# Patient Record
Sex: Female | Born: 1970 | Race: Black or African American | Hispanic: No | Marital: Married | Smoking: Never smoker
Health system: Southern US, Community
[De-identification: ages and names within clinical notes are randomized; demographics above are authoritative.]

## PROBLEM LIST (undated history)

## (undated) DIAGNOSIS — I10 Essential (primary) hypertension: Secondary | ICD-10-CM

## (undated) HISTORY — PX: TEAR DUCT PROBING: SHX793

## (undated) HISTORY — DX: Essential (primary) hypertension: I10

---

## 2006-04-02 ENCOUNTER — Ambulatory Visit: Payer: Self-pay

## 2007-04-29 ENCOUNTER — Emergency Department: Payer: Self-pay | Admitting: Emergency Medicine

## 2009-03-21 ENCOUNTER — Encounter: Payer: Self-pay | Admitting: Obstetrics and Gynecology

## 2009-04-25 ENCOUNTER — Encounter: Payer: Self-pay | Admitting: Obstetrics and Gynecology

## 2009-09-02 ENCOUNTER — Ambulatory Visit: Payer: Self-pay

## 2009-10-30 ENCOUNTER — Inpatient Hospital Stay: Payer: Self-pay

## 2010-01-25 DIAGNOSIS — K219 Gastro-esophageal reflux disease without esophagitis: Secondary | ICD-10-CM | POA: Insufficient documentation

## 2013-11-08 ENCOUNTER — Ambulatory Visit: Payer: Self-pay | Admitting: Family Medicine

## 2013-11-08 DIAGNOSIS — I059 Rheumatic mitral valve disease, unspecified: Secondary | ICD-10-CM

## 2013-11-08 LAB — LIPID PANEL
Cholesterol: 180 mg/dL (ref 0–200)
HDL: 53 mg/dL (ref 35–70)
LDL Cholesterol: 112 mg/dL
Triglycerides: 77 mg/dL (ref 40–160)

## 2013-11-09 ENCOUNTER — Encounter: Payer: Self-pay | Admitting: Cardiovascular Disease

## 2014-04-01 ENCOUNTER — Emergency Department: Payer: Self-pay | Admitting: Emergency Medicine

## 2014-05-16 LAB — TSH: TSH: 1.53 u[IU]/mL (ref <=5.90)

## 2014-09-26 ENCOUNTER — Encounter: Payer: Self-pay | Admitting: Emergency Medicine

## 2014-11-07 ENCOUNTER — Other Ambulatory Visit: Payer: Self-pay | Admitting: Family Medicine

## 2014-11-15 ENCOUNTER — Ambulatory Visit: Payer: Self-pay | Admitting: Family Medicine

## 2014-12-20 ENCOUNTER — Encounter (INDEPENDENT_AMBULATORY_CARE_PROVIDER_SITE_OTHER): Payer: Self-pay

## 2014-12-20 ENCOUNTER — Encounter: Payer: Self-pay | Admitting: Family Medicine

## 2014-12-20 ENCOUNTER — Ambulatory Visit (INDEPENDENT_AMBULATORY_CARE_PROVIDER_SITE_OTHER): Payer: BLUE CROSS/BLUE SHIELD | Admitting: Family Medicine

## 2014-12-20 VITALS — BP 110/60 | HR 100 | Temp 97.2°F | Resp 16 | Ht 61.0 in | Wt 155.1 lb

## 2014-12-20 DIAGNOSIS — E669 Obesity, unspecified: Secondary | ICD-10-CM | POA: Diagnosis not present

## 2014-12-20 DIAGNOSIS — I10 Essential (primary) hypertension: Secondary | ICD-10-CM | POA: Diagnosis not present

## 2014-12-20 MED ORDER — LISINOPRIL-HYDROCHLOROTHIAZIDE 10-12.5 MG PO TABS: 1.0000 | ORAL_TABLET | Freq: Every day | ORAL | Status: DC

## 2014-12-20 MED ORDER — ORLISTAT 120 MG PO CAPS
120.0000 mg | ORAL_CAPSULE | Freq: Three times a day (TID) | ORAL | Status: DC
Start: 2014-12-20 — End: 2015-11-07

## 2014-12-20 NOTE — Patient Instructions (Signed)

## 2014-12-20 NOTE — Progress Notes (Signed)
Name: [name] [name]   MRN: [id number]    DOB: 16-May-1971   Date:12/20/2014       Progress Note  Subjective  Chief Complaint  Chief Complaint  Patient presents with  . Hypertension    Hypertension This is a chronic problem. The current episode started more than 1 year ago. The problem is unchanged. The problem is controlled. Associated symptoms include anxiety. Pertinent negatives include no blurred vision, chest pain, headaches, neck pain, orthopnea, palpitations or shortness of breath. Risk factors for coronary artery disease include sedentary lifestyle, stress and obesity. Past treatments include ACE inhibitors and diuretics. The current treatment provides moderate improvement. There are no compliance problems.     OBESITY  Patient has a long-standing history of chronic problem. To start exercising with regularity willing encourage Paleo diet  Past Medical History  Diagnosis Date  . Essential hypertension, benign     History  Substance Use Topics  . Smoking status: Never Smoker   . Smokeless tobacco: Not on file  . Alcohol Use: No     Current outpatient prescriptions:  .  lisinopril-hydrochlorothiazide (PRINZIDE,ZESTORETIC) 10-12.5 MG per tablet, Take 1 tablet by mouth daily., Disp: 90 tablet, Rfl: 1 .  Omega-3 Fatty Acids (FISH OIL) 645 MG CAPS, Take by mouth., Disp: , Rfl:   Allergies  Allergen Reactions  . Zantac [Ranitidine Hcl]     Review of Systems  Constitutional: Negative for fever, chills and weight loss.  HENT: Negative for congestion, hearing loss, sore throat and tinnitus.   Eyes: Negative for blurred vision, double vision and redness.  Respiratory: Negative for cough, hemoptysis and shortness of breath.   Cardiovascular: Negative for chest pain, palpitations, orthopnea, claudication and leg swelling.  Gastrointestinal: Negative for heartburn, nausea, vomiting, diarrhea, constipation and blood in stool.  Genitourinary: Negative for dysuria, urgency,  frequency and hematuria.  Musculoskeletal: Negative for myalgias, back pain, joint pain, falls and neck pain.  Skin: Negative for itching.  Neurological: Negative for dizziness, tingling, tremors, focal weakness, seizures, loss of consciousness, weakness and headaches.  Endo/Heme/Allergies: Does not bruise/bleed easily.  Psychiatric/Behavioral: Negative for depression and substance abuse. The patient is not nervous/anxious and does not have insomnia.      Objective  Filed Vitals:   12/20/14 0742  BP: 110/60  Pulse: 100  Temp: 97.2 F (36.2 C)  TempSrc: Oral  Resp: 16  Height: 5\' 1"  (1.549 m)  Weight: 155 lb 1.6 oz (70.353 kg)  SpO2: 98%     Physical Exam  Constitutional: She is oriented to person, place, and time and well-developed, well-nourished, and in no distress.  Overweight bordering obese  HENT:  Head: Normocephalic.  Eyes: EOM are normal. Pupils are equal, round, and reactive to light.  Neck: Normal range of motion. No thyromegaly present.  Cardiovascular: Normal rate, regular rhythm and normal heart sounds.   No murmur heard. Pulmonary/Chest: Effort normal and breath sounds normal.  Musculoskeletal: Normal range of motion. She exhibits no edema.  Neurological: She is alert and oriented to person, place, and time. No cranial nerve deficit. Gait normal.  Skin: Skin is warm and dry. No rash noted.  Psychiatric: Memory and affect normal.      Assessment & Plan  1. Essential hypertension Well-controlled - COMPLETE METABOLIC PANEL WITH GFR  2. Obesity Prudent diet and exercise - orlistat (XENICAL) 120 MG capsule; Take 1 capsule (120 mg total) by mouth 3 (three) times daily with meals.  Dispense: 90 capsule; Refill: 1

## 2015-02-28 ENCOUNTER — Encounter: Payer: Self-pay | Admitting: Family Medicine

## 2015-02-28 ENCOUNTER — Ambulatory Visit (INDEPENDENT_AMBULATORY_CARE_PROVIDER_SITE_OTHER): Payer: BLUE CROSS/BLUE SHIELD | Admitting: Family Medicine

## 2015-02-28 VITALS — BP 109/71 | HR 110 | Temp 98.9°F | Resp 17 | Ht 61.0 in | Wt 155.7 lb

## 2015-02-28 DIAGNOSIS — Z23 Encounter for immunization: Secondary | ICD-10-CM

## 2015-02-28 DIAGNOSIS — N3000 Acute cystitis without hematuria: Secondary | ICD-10-CM | POA: Diagnosis not present

## 2015-02-28 MED ORDER — NITROFURANTOIN MONOHYD MACRO 100 MG PO CAPS: 100.0000 mg | ORAL_CAPSULE | Freq: Two times a day (BID) | ORAL | Status: DC

## 2015-02-28 NOTE — Progress Notes (Signed)
Name: [name] [name]   MRN: [id number]    DOB: 27-Jun-1970   Date:02/28/2015       Progress Note  Subjective  Chief Complaint  Chief Complaint  Patient presents with  . Urinary Tract Infection  . Hypertension  . Hyperlipidemia  . Gastrophageal Reflux    Urinary Tract Infection  This is a new problem. The current episode started today. The quality of the pain is described as burning. There has been no fever. There is no history of pyelonephritis. Associated symptoms include frequency. Pertinent negatives include no chills, discharge or flank pain. She has tried nothing for the symptoms.   symptoms are similar to prior episodes of UTI.  Past Medical History  Diagnosis Date  . Essential hypertension, benign     Past Surgical History  Procedure Laterality Date  . Tear duct probing    . Cesarean section      Family History  Problem Relation Age of Onset  . Hypertension Mother   . Hyperlipidemia Mother   . Hypertension Father     Social History   Social History  . Marital Status: Married    Spouse Name: N/A  . Number of Children: N/A  . Years of Education: N/A   Occupational History  . Not on file.   Social History Main Topics  . Smoking status: Never Smoker   . Smokeless tobacco: Not on file  . Alcohol Use: No  . Drug Use: No  . Sexual Activity:    Partners: Female   Other Topics Concern  . Not on file   Social History Narrative    Current outpatient prescriptions:  .  lisinopril-hydrochlorothiazide (PRINZIDE,ZESTORETIC) 10-12.5 MG per tablet, Take 1 tablet by mouth daily., Disp: 90 tablet, Rfl: 1 .  Omega-3 Fatty Acids (FISH OIL) 645 MG CAPS, Take by mouth., Disp: , Rfl:  .  orlistat (XENICAL) 120 MG capsule, Take 1 capsule (120 mg total) by mouth 3 (three) times daily with meals. (Patient not taking: Reported on 02/28/2015), Disp: 90 capsule, Rfl: 1  Allergies  Allergen Reactions  . Zantac [Ranitidine Hcl]    Review of Systems  Constitutional:  Negative for chills.  Genitourinary: Positive for frequency. Negative for flank pain.    Objective  Filed Vitals:   02/28/15 0901  BP: 109/71  Pulse: 110  Temp: 98.9 F (37.2 C)  TempSrc: Oral  Resp: 17  Height: 5\' 1"  (1.549 m)  Weight: 155 lb 11.2 oz (70.625 kg)  SpO2: 99%    Physical Exam  Constitutional: She is well-developed, well-nourished, and in no distress.  Abdominal: Soft. Bowel sounds are normal. There is no CVA tenderness.  Nursing note and vitals reviewed.  Assessment & Plan  1. Need for immunization against influenza   2. Acute cystitis without hematuria Start patient on nitrofurantoin based on history and presentation. Obtain urinalysis, microscopic exam, and culture for identification of pathogen. Follow-up if no clinical improvement.  - nitrofurantoin, macrocrystal-monohydrate, (MACROBID) 100 MG capsule; Take 1 capsule (100 mg total) by mouth 2 (two) times daily.  Dispense: 14 capsule; Refill: 0 - Urinalysis, Routine w reflex microscopic - Urine Culture    Syed Asad [facility] 02/28/2015 9:41 AM

## 2015-03-01 LAB — URINALYSIS, ROUTINE W REFLEX MICROSCOPIC
Bilirubin, UA: NEGATIVE
Glucose, UA: NEGATIVE
Ketones, UA: NEGATIVE
Nitrite, UA: NEGATIVE
Specific Gravity, UA: 1.027 (ref 1.005–1.030)
Urobilinogen, Ur: 1 mg/dL (ref 0.2–1.0)
pH, UA: 6.5 (ref 5.0–7.5)

## 2015-03-01 LAB — MICROSCOPIC EXAMINATION
Casts: NONE SEEN /lpf
RBC, UA: >30 /hpf — AB (ref 0–?)
WBC, UA: >30 /hpf — AB (ref 0–?)

## 2015-03-02 LAB — URINE CULTURE

## 2015-04-04 LAB — HM PAP SMEAR: HM Pap smear: NEGATIVE

## 2015-05-01 ENCOUNTER — Encounter: Payer: Self-pay | Admitting: Family Medicine

## 2015-07-03 ENCOUNTER — Other Ambulatory Visit: Payer: Self-pay | Admitting: Family Medicine

## 2015-08-30 ENCOUNTER — Other Ambulatory Visit: Payer: Self-pay | Admitting: Family Medicine

## 2015-09-13 DIAGNOSIS — B373 Candidiasis of vulva and vagina: Secondary | ICD-10-CM | POA: Diagnosis not present

## 2015-09-26 ENCOUNTER — Encounter: Payer: Self-pay | Admitting: Family Medicine

## 2015-09-26 ENCOUNTER — Ambulatory Visit (INDEPENDENT_AMBULATORY_CARE_PROVIDER_SITE_OTHER): Payer: BLUE CROSS/BLUE SHIELD | Admitting: Family Medicine

## 2015-09-26 VITALS — BP 107/71 | HR 111 | Temp 98.8°F | Resp 18 | Ht 61.0 in | Wt 154.3 lb

## 2015-09-26 DIAGNOSIS — B373 Candidiasis of vulva and vagina: Secondary | ICD-10-CM

## 2015-09-26 DIAGNOSIS — B3731 Acute candidiasis of vulva and vagina: Secondary | ICD-10-CM

## 2015-09-26 MED ORDER — NYSTATIN 100000 UNIT/GM EX OINT: 1.0000 "application " | TOPICAL_OINTMENT | Freq: Two times a day (BID) | CUTANEOUS | Status: DC

## 2015-09-26 NOTE — Progress Notes (Signed)
Name: [name] [name]   MRN: [id number]    DOB: 20-Jun-1970   Date:09/26/2015       Progress Note  Subjective  Chief Complaint  Chief Complaint  Patient presents with  . Hemorrhoids    HPI  Pt. Presents for discomfort in her rectal and perianal area. Initially seen by Gynecologist and had a Pelvic exam and was diagnosed with a yeast infection, which is better after antifungal therapy. She feels her anal skin is burning, itching. Has tried applying Preparation H, which helped relieve her symptoms.   Past Medical History  Diagnosis Date  . Essential hypertension, benign     Past Surgical History  Procedure Laterality Date  . Tear duct probing    . Cesarean section      Family History  Problem Relation Age of Onset  . Hypertension Mother   . Hyperlipidemia Mother   . Hypertension Father     Social History   Social History  . Marital Status: Married    Spouse Name: N/A  . Number of Children: N/A  . Years of Education: N/A   Occupational History  . Not on file.   Social History Main Topics  . Smoking status: Never Smoker   . Smokeless tobacco: Not on file  . Alcohol Use: No  . Drug Use: No  . Sexual Activity:    Partners: Female   Other Topics Concern  . Not on file   Social History Narrative     Current outpatient prescriptions:  .  lisinopril-hydrochlorothiazide (PRINZIDE,ZESTORETIC) 10-12.5 MG tablet, TAKE 1 TABLET BY MOUTH DAILY., Disp: 30 tablet, Rfl: 1 .  Omega-3 Fatty Acids (FISH OIL) 645 MG CAPS, Take by mouth., Disp: , Rfl:  .  orlistat (XENICAL) 120 MG capsule, Take 1 capsule (120 mg total) by mouth 3 (three) times daily with meals., Disp: 90 capsule, Rfl: 1 .  nitrofurantoin, macrocrystal-monohydrate, (MACROBID) 100 MG capsule, Take 1 capsule (100 mg total) by mouth 2 (two) times daily. (Patient not taking: Reported on 09/26/2015), Disp: 14 capsule, Rfl: 0  Allergies  Allergen Reactions  . Zantac [Ranitidine Hcl]      Review of Systems   Constitutional: Negative for fever and chills.  Skin: Positive for itching and rash.      Objective  Filed Vitals:   09/26/15 1138  BP: 107/71  Pulse: 111  Temp: 98.8 F (37.1 C)  TempSrc: Oral  Resp: 18  Height: 5\' 1"  (1.549 m)  Weight: 154 lb 4.8 oz (69.99 kg)  SpO2: 99%    Physical Exam  Constitutional: She is oriented to person, place, and time and well-developed, well-nourished, and in no distress.  Genitourinary: Vulva exhibits rash.  Macular, erythematous pruritic lesion on the right groin area next to labia majora extending down to perianal area.   Neurological: She is alert and oriented to person, place, and time.  Nursing note and vitals reviewed.      Assessment & Plan  1. Vulvovaginal candidiasis Lesion consistent with candidiasis, will start on topical nystatin therapy. Patient to follow up with gynecology. - nystatin ointment (MYCOSTATIN); Apply 1 application topically 2 (two) times daily.  Dispense: 30 g; Refill: 0   Cashis Rill Asad A[facility] 09/26/2015 12:17 PM

## 2015-10-02 ENCOUNTER — Ambulatory Visit: Payer: BLUE CROSS/BLUE SHIELD | Admitting: Family Medicine

## 2015-11-05 ENCOUNTER — Other Ambulatory Visit: Payer: Self-pay | Admitting: Family Medicine

## 2015-11-06 ENCOUNTER — Telehealth: Payer: Self-pay | Admitting: Family Medicine

## 2015-11-06 NOTE — Telephone Encounter (Signed)
Patient informed. 

## 2015-11-06 NOTE — Telephone Encounter (Signed)
Was seen last month but only have 2 pills left of lisinopril. She is requesting a refill

## 2015-11-06 NOTE — Telephone Encounter (Signed)
Patient is scheduled for 11/07/2015 for an office visit appointment. Will discuss and provide medication refill at that time

## 2015-11-07 ENCOUNTER — Encounter: Payer: Self-pay | Admitting: Family Medicine

## 2015-11-07 ENCOUNTER — Ambulatory Visit (INDEPENDENT_AMBULATORY_CARE_PROVIDER_SITE_OTHER): Payer: BLUE CROSS/BLUE SHIELD | Admitting: Family Medicine

## 2015-11-07 VITALS — BP 118/72 | HR 118 | Temp 98.6°F | Resp 18 | Ht 61.0 in | Wt 156.2 lb

## 2015-11-07 DIAGNOSIS — I10 Essential (primary) hypertension: Secondary | ICD-10-CM | POA: Diagnosis not present

## 2015-11-07 MED ORDER — LISINOPRIL-HYDROCHLOROTHIAZIDE 10-12.5 MG PO TABS: 1.0000 | ORAL_TABLET | Freq: Every day | ORAL | Status: DC

## 2015-11-07 NOTE — Progress Notes (Signed)
Name: [name] [name]   MRN: [id number]    DOB: 22-Jul-1970   Date:11/07/2015       Progress Note  Subjective  Chief Complaint  Chief Complaint  Patient presents with  . Medication Refill    B/P meds    Hypertension The problem is controlled. Pertinent negatives include no chest pain, headaches or palpitations. Past treatments include ACE inhibitors and diuretics.    Past Medical History  Diagnosis Date  . Essential hypertension, benign     Past Surgical History  Procedure Laterality Date  . Tear duct probing    . Cesarean section      Family History  Problem Relation Age of Onset  . Hypertension Mother   . Hyperlipidemia Mother   . Hypertension Father     Social History   Social History  . Marital Status: Married    Spouse Name: N/A  . Number of Children: N/A  . Years of Education: N/A   Occupational History  . Not on file.   Social History Main Topics  . Smoking status: Never Smoker   . Smokeless tobacco: Not on file  . Alcohol Use: No  . Drug Use: No  . Sexual Activity:    Partners: Female   Other Topics Concern  . Not on file   Social History Narrative     Current outpatient prescriptions:  .  lisinopril-hydrochlorothiazide (PRINZIDE,ZESTORETIC) 10-12.5 MG tablet, TAKE 1 TABLET BY MOUTH DAILY., Disp: 30 tablet, Rfl: 1 .  Omega-3 Fatty Acids (FISH OIL) 645 MG CAPS, Take by mouth., Disp: , Rfl:  .  nitrofurantoin, macrocrystal-monohydrate, (MACROBID) 100 MG capsule, Take 1 capsule (100 mg total) by mouth 2 (two) times daily. (Patient not taking: Reported on 09/26/2015), Disp: 14 capsule, Rfl: 0 .  nystatin ointment (MYCOSTATIN), Apply 1 application topically 2 (two) times daily., Disp: 30 g, Rfl: 0  Allergies  Allergen Reactions  . Zantac [Ranitidine Hcl]      Review of Systems  Cardiovascular: Negative for chest pain and palpitations.  Neurological: Negative for headaches.    Objective  Filed Vitals:   11/07/15 1538  BP: 118/72   Pulse: 118  Temp: 98.6 F (37 C)  Resp: 18  Height: 5\' 1"  (1.549 m)  Weight: 156 lb 4 oz (70.875 kg)  SpO2: 97%    Physical Exam  Constitutional: She is oriented to person, place, and time and well-developed, well-nourished, and in no distress.  HENT:  Head: Normocephalic and atraumatic.  Cardiovascular: Normal rate and regular rhythm.   Pulmonary/Chest: Effort normal and breath sounds normal.  Musculoskeletal: She exhibits no edema.  Neurological: She is alert and oriented to person, place, and time.  Nursing note and vitals reviewed.     Assessment & Plan  1. Essential hypertension Blood pressure is stable and controlled on present antihypertensive therapy. Refills provided - lisinopril-hydrochlorothiazide (PRINZIDE,ZESTORETIC) 10-12.5 MG tablet; Take 1 tablet by mouth daily.  Dispense: 90 tablet; Refill: 1   Kamoni Depree Asad A[facility] 11/07/2015 3:54 PM

## 2016-05-04 ENCOUNTER — Other Ambulatory Visit: Payer: Self-pay | Admitting: Family Medicine

## 2016-05-04 DIAGNOSIS — I10 Essential (primary) hypertension: Secondary | ICD-10-CM

## 2016-05-05 ENCOUNTER — Other Ambulatory Visit: Payer: Self-pay | Admitting: Family Medicine

## 2016-05-05 ENCOUNTER — Other Ambulatory Visit: Payer: Self-pay | Admitting: Emergency Medicine

## 2016-05-05 DIAGNOSIS — I10 Essential (primary) hypertension: Secondary | ICD-10-CM

## 2016-05-05 MED ORDER — LISINOPRIL-HYDROCHLOROTHIAZIDE 10-12.5 MG PO TABS: 1.0000 | ORAL_TABLET | Freq: Every day | ORAL | 0 refills | Status: DC

## 2016-05-08 ENCOUNTER — Ambulatory Visit (INDEPENDENT_AMBULATORY_CARE_PROVIDER_SITE_OTHER): Payer: BLUE CROSS/BLUE SHIELD | Admitting: Family Medicine

## 2016-05-08 ENCOUNTER — Encounter: Payer: Self-pay | Admitting: Family Medicine

## 2016-05-08 DIAGNOSIS — I10 Essential (primary) hypertension: Secondary | ICD-10-CM | POA: Diagnosis not present

## 2016-05-08 DIAGNOSIS — E785 Hyperlipidemia, unspecified: Secondary | ICD-10-CM | POA: Diagnosis not present

## 2016-05-08 MED ORDER — LISINOPRIL-HYDROCHLOROTHIAZIDE 10-12.5 MG PO TABS: 1.0000 | ORAL_TABLET | Freq: Every day | ORAL | 1 refills | Status: DC

## 2016-05-08 NOTE — Progress Notes (Signed)
Name: [name] [name]   MRN: [id number]    DOB: July 01, 1970   Date:05/08/2016       Progress Note  Subjective  Chief Complaint  Chief Complaint  Patient presents with  . Hypertension    follow up medication refills    Hypertension  This is a chronic problem. The problem is unchanged. The problem is controlled. Pertinent negatives include no blurred vision, chest pain, headaches, palpitations or shortness of breath. Past treatments include ACE inhibitors and diuretics. There is no history of kidney disease, CAD/MI or CVA.  Hyperlipidemia  This is a chronic problem. The problem is uncontrolled. Recent lipid tests were reviewed and are high. Pertinent negatives include no chest pain or shortness of breath. She is currently on no antihyperlipidemic treatment.      Past Medical History:  Diagnosis Date  . Essential hypertension, benign     Past Surgical History:  Procedure Laterality Date  . CESAREAN SECTION    . TEAR DUCT PROBING      Family History  Problem Relation Age of Onset  . Hypertension Mother   . Hyperlipidemia Mother   . Hypertension Father     Social History   Social History  . Marital status: Married    Spouse name: N/A  . Number of children: N/A  . Years of education: N/A   Occupational History  . Not on file.   Social History Main Topics  . Smoking status: Never Smoker  . Smokeless tobacco: Not on file  . Alcohol use No  . Drug use: No  . Sexual activity: Yes    Partners: Female   Other Topics Concern  . Not on file   Social History Narrative  . No narrative on file     Current Outpatient Prescriptions:  .  lisinopril-hydrochlorothiazide (PRINZIDE,ZESTORETIC) 10-12.5 MG tablet, Take 1 tablet by mouth daily., Disp: 5 tablet, Rfl: 0 .  Omega-3 Fatty Acids (FISH OIL) 645 MG CAPS, Take by mouth., Disp: , Rfl:   Allergies  Allergen Reactions  . Zantac [Ranitidine Hcl]      Review of Systems  Eyes: Negative for blurred vision.   Respiratory: Negative for shortness of breath.   Cardiovascular: Negative for chest pain and palpitations.  Neurological: Negative for headaches.     Objective  Vitals:   05/08/16 0810  BP: 124/80  Pulse: (!) 102  Resp: 16  Temp: 98.5 F (36.9 C)  TempSrc: Oral  SpO2: 98%  Weight: 158 lb 4.8 oz (71.8 kg)  Height: 5\' 1"  (1.549 m)    Physical Exam  Constitutional: She is oriented to person, place, and time and well-developed, well-nourished, and in no distress.  HENT:  Head: Normocephalic and atraumatic.  Eyes: Conjunctivae are normal. Pupils are equal, round, and reactive to light.  Cardiovascular: Normal rate, regular rhythm and normal heart sounds.   No murmur heard. Pulmonary/Chest: Effort normal and breath sounds normal. No respiratory distress. She has no wheezes.  Musculoskeletal: She exhibits no edema.  Neurological: She is alert and oriented to person, place, and time.  Psychiatric: Mood, memory, affect and judgment normal.  Nursing note and vitals reviewed.       Assessment & Plan  1. Essential hypertension  - lisinopril-hydrochlorothiazide (PRINZIDE,ZESTORETIC) 10-12.5 MG tablet; Take 1 tablet by mouth daily.  Dispense: 90 tablet; Refill: 1  2. Hyperlipidemia, unspecified hyperlipidemia type  - Lipid Profile - COMPLETE METABOLIC PANEL WITH GFR   De Jaworski Asad A. [facility] 05/08/2016  8:35 AM

## 2016-05-09 LAB — COMPLETE METABOLIC PANEL WITH GFR
ALT: 13 U/L (ref 6–29)
AST: 18 U/L (ref 10–35)
Albumin: 4.2 g/dL (ref 3.6–5.1)
Alkaline Phosphatase: 50 U/L (ref 33–115)
BUN: 11 mg/dL (ref 7–25)
CO2: 27 mmol/L (ref 20–31)
Calcium: 9.3 mg/dL (ref 8.6–10.2)
Chloride: 103 mmol/L (ref 98–110)
Creat: 0.87 mg/dL (ref 0.50–1.10)
GFR, Est African American: >89 mL/min (ref >=60)
GFR, Est Non African American: 80 mL/min (ref >=60)
Glucose, Bld: 85 mg/dL (ref 65–99)
Potassium: 4.3 mmol/L (ref 3.5–5.3)
Sodium: 137 mmol/L (ref 135–146)
Total Bilirubin: 0.3 mg/dL (ref 0.2–1.2)
Total Protein: 6.8 g/dL (ref 6.1–8.1)

## 2016-05-09 LAB — LIPID PANEL
Cholesterol: 182 mg/dL (ref <200)
HDL: 50 mg/dL — ABNORMAL LOW (ref >50)
LDL Cholesterol: 112 mg/dL — ABNORMAL HIGH (ref <100)
Total CHOL/HDL Ratio: 3.6 Ratio (ref <5.0)
Triglycerides: 100 mg/dL (ref <150)
VLDL: 20 mg/dL (ref <30)

## 2016-10-27 ENCOUNTER — Other Ambulatory Visit: Payer: Self-pay | Admitting: Family Medicine

## 2016-10-27 DIAGNOSIS — I10 Essential (primary) hypertension: Secondary | ICD-10-CM

## 2016-10-30 ENCOUNTER — Telehealth: Payer: Self-pay | Admitting: Family Medicine

## 2016-10-30 ENCOUNTER — Other Ambulatory Visit: Payer: Self-pay | Admitting: Emergency Medicine

## 2016-10-30 DIAGNOSIS — I10 Essential (primary) hypertension: Secondary | ICD-10-CM

## 2016-10-30 MED ORDER — LISINOPRIL-HYDROCHLOROTHIAZIDE 10-12.5 MG PO TABS: 1.0000 | ORAL_TABLET | Freq: Every day | ORAL | 0 refills | Status: DC

## 2016-10-30 NOTE — Telephone Encounter (Signed)
Pt is needing refills on lisinopril. You had said that she needs to make an appt .She has made an  appt for Tuesday the 29th but is out of her medication. Pharm is [facility].

## 2016-11-03 ENCOUNTER — Encounter: Payer: Self-pay | Admitting: Family Medicine

## 2016-11-03 ENCOUNTER — Ambulatory Visit (INDEPENDENT_AMBULATORY_CARE_PROVIDER_SITE_OTHER): Payer: BLUE CROSS/BLUE SHIELD | Admitting: Family Medicine

## 2016-11-03 DIAGNOSIS — I10 Essential (primary) hypertension: Secondary | ICD-10-CM | POA: Diagnosis not present

## 2016-11-03 MED ORDER — LISINOPRIL-HYDROCHLOROTHIAZIDE 10-12.5 MG PO TABS: 1.0000 | ORAL_TABLET | Freq: Every day | ORAL | 1 refills | Status: DC

## 2016-11-03 NOTE — Progress Notes (Signed)
Name: [name] [name]   MRN: [id number]    DOB: Apr 19, 1971   Date:11/03/2016       Progress Note  Subjective  Chief Complaint  Chief Complaint  Patient presents with  . Medication Refill    Hypertension  This is a chronic problem. The problem is unchanged. The problem is controlled. Pertinent negatives include no blurred vision, chest pain, headaches or palpitations. Past treatments include ACE inhibitors and diuretics. There is no history of kidney disease, CAD/MI or CVA.     Past Medical History:  Diagnosis Date  . Essential hypertension, benign     Past Surgical History:  Procedure Laterality Date  . CESAREAN SECTION    . TEAR DUCT PROBING      Family History  Problem Relation Age of Onset  . Hypertension Mother   . Hyperlipidemia Mother   . Hypertension Father     Social History   Social History  . Marital status: Married    Spouse name: N/A  . Number of children: N/A  . Years of education: N/A   Occupational History  . Not on file.   Social History Main Topics  . Smoking status: Never Smoker  . Smokeless tobacco: Never Used  . Alcohol use No  . Drug use: No  . Sexual activity: Yes    Partners: Female   Other Topics Concern  . Not on file   Social History Narrative  . No narrative on file     Current Outpatient Prescriptions:  .  lisinopril-hydrochlorothiazide (PRINZIDE,ZESTORETIC) 10-12.5 MG tablet, Take 1 tablet by mouth daily., Disp: 30 tablet, Rfl: 0 .  Omega-3 Fatty Acids (FISH OIL) 645 MG CAPS, Take by mouth., Disp: , Rfl:   Allergies  Allergen Reactions  . Zantac [Ranitidine Hcl]      Review of Systems  Eyes: Negative for blurred vision.  Cardiovascular: Negative for chest pain and palpitations.  Neurological: Negative for headaches.     Objective  Vitals:   11/03/16 1350  BP: 118/66  Pulse: (!) 108  Resp: 16  Temp: 98.3 F (36.8 C)  TempSrc: Oral  SpO2: 99%  Weight: 157 lb 9.6 oz (71.5 kg)  Height: 5\' 1"  (1.549 m)     Physical Exam  Constitutional: She is oriented to person, place, and time and well-developed, well-nourished, and in no distress.  HENT:  Head: Normocephalic and atraumatic.  Eyes: Conjunctivae are normal. Pupils are equal, round, and reactive to light.  Cardiovascular: Normal rate, regular rhythm and normal heart sounds.   No murmur heard. Pulmonary/Chest: Effort normal and breath sounds normal. No respiratory distress. She has no wheezes.  Musculoskeletal: She exhibits no edema.  Neurological: She is alert and oriented to person, place, and time.  Psychiatric: Mood, memory, affect and judgment normal.  Nursing note and vitals reviewed.       Assessment & Plan  1. Essential hypertension BP stable on present anti- hypertensive therapy, refills provided - lisinopril-hydrochlorothiazide (PRINZIDE,ZESTORETIC) 10-12.5 MG tablet; Take 1 tablet by mouth daily.  Dispense: 90 tablet; Refill: 1   Jeremy Mclamb Asad A. [facility] 11/03/2016 2:04 PM

## 2016-12-18 ENCOUNTER — Encounter: Payer: BLUE CROSS/BLUE SHIELD | Admitting: Family Medicine

## 2016-12-23 ENCOUNTER — Encounter: Payer: Self-pay | Admitting: Family Medicine

## 2016-12-23 ENCOUNTER — Ambulatory Visit (INDEPENDENT_AMBULATORY_CARE_PROVIDER_SITE_OTHER): Payer: BLUE CROSS/BLUE SHIELD | Admitting: Family Medicine

## 2016-12-23 VITALS — BP 120/77 | HR 95 | Temp 98.0°F | Resp 16 | Ht 61.0 in | Wt 153.8 lb

## 2016-12-23 DIAGNOSIS — Z0000 Encounter for general adult medical examination without abnormal findings: Secondary | ICD-10-CM

## 2016-12-23 LAB — CBC WITH DIFFERENTIAL/PLATELET
Basophils Absolute: 70 cells/uL (ref 0–200)
Basophils Relative: 1 %
Eosinophils Absolute: 140 cells/uL (ref 15–500)
Eosinophils Relative: 2 %
HCT: 32.5 % — ABNORMAL LOW (ref 35.0–45.0)
Hemoglobin: 9.4 g/dL — ABNORMAL LOW (ref 11.7–15.5)
Lymphocytes Relative: 20 %
Lymphs Abs: 1400 cells/uL (ref 850–3900)
MCH: 20 pg — ABNORMAL LOW (ref 27.0–33.0)
MCHC: 28.9 g/dL — ABNORMAL LOW (ref 32.0–36.0)
MCV: 69.3 fL — ABNORMAL LOW (ref 80.0–100.0)
MPV: 9.1 fL (ref 7.5–12.5)
Monocytes Absolute: 490 cells/uL (ref 200–950)
Monocytes Relative: 7 %
Neutro Abs: 4900 cells/uL (ref 1500–7800)
Neutrophils Relative %: 70 %
Platelets: 520 10*3/uL — ABNORMAL HIGH (ref 140–400)
RBC: 4.69 MIL/uL (ref 3.80–5.10)
RDW: 18.5 % — ABNORMAL HIGH (ref 11.0–15.0)
WBC: 7 10*3/uL (ref 3.8–10.8)

## 2016-12-23 LAB — TSH: TSH: 1.32 mIU/L

## 2016-12-23 NOTE — Progress Notes (Signed)
Name: [name] [name]   MRN: [id number]    DOB: 1970-06-19   Date:12/23/2016       Progress Note  Subjective  Chief Complaint  Chief Complaint  Patient presents with  . Annual Exam    CPE w/o pap    HPI  Pt. Presents for Complete Physical Exam.  She follows up with Gynecology for female exams and screenings.    Past Medical History:  Diagnosis Date  . Essential hypertension, benign     Past Surgical History:  Procedure Laterality Date  . CESAREAN SECTION    . TEAR DUCT PROBING      Family History  Problem Relation Age of Onset  . Hypertension Mother   . Hyperlipidemia Mother   . Hypertension Father     Social History   Social History  . Marital status: Married    Spouse name: N/A  . Number of children: N/A  . Years of education: N/A   Occupational History  . Not on file.   Social History Main Topics  . Smoking status: Never Smoker  . Smokeless tobacco: Never Used  . Alcohol use No  . Drug use: No  . Sexual activity: Yes    Partners: Female   Other Topics Concern  . Not on file   Social History Narrative  . No narrative on file     Current Outpatient Prescriptions:  .  lisinopril-hydrochlorothiazide (PRINZIDE,ZESTORETIC) 10-12.5 MG tablet, Take 1 tablet by mouth daily., Disp: 90 tablet, Rfl: 1 .  Omega-3 Fatty Acids (FISH OIL) 645 MG CAPS, Take by mouth., Disp: , Rfl:   Allergies  Allergen Reactions  . Zantac [Ranitidine Hcl]      Review of Systems  Constitutional: Negative for chills, fever and malaise/fatigue.  HENT: Negative for congestion, ear pain and sore throat.   Eyes: Negative for blurred vision and double vision.  Respiratory: Negative for cough and shortness of breath.   Cardiovascular: Negative for chest pain, palpitations and leg swelling.  Gastrointestinal: Negative for abdominal pain, blood in stool, constipation, diarrhea, nausea and vomiting.  Genitourinary: Negative for dysuria and hematuria.  Musculoskeletal: Positive  for back pain (occasional back pain).  Neurological: Negative for dizziness and headaches.  Psychiatric/Behavioral: Negative for depression. The patient is nervous/anxious (in the past had episodes of nervousness and anxiety).     Objective  Vitals:   12/23/16 1037  BP: 120/77  Pulse: 95  Resp: 16  Temp: 98 F (36.7 C)  TempSrc: Oral  SpO2: 97%  Weight: 153 lb 12.8 oz (69.8 kg)  Height: 5\' 1"  (1.549 m)    Physical Exam  Constitutional: She is oriented to person, place, and time and well-developed, well-nourished, and in no distress.  HENT:  Head: Normocephalic and atraumatic.  Right Ear: External ear normal.  Left Ear: External ear normal.  Mouth/Throat: Oropharynx is clear and moist.  Eyes: Pupils are equal, round, and reactive to light.  Cardiovascular: Normal rate, regular rhythm and normal heart sounds.   No murmur heard. Pulmonary/Chest: Effort normal and breath sounds normal. She has no wheezes.  Abdominal: Soft. Bowel sounds are normal. There is no tenderness.  Musculoskeletal: She exhibits no edema.  Neurological: She is alert and oriented to person, place, and time.  Psychiatric: Mood, memory, affect and judgment normal.  Nursing note and vitals reviewed.      Assessment & Plan  1. Well woman exam without gynecological exam Obtain age-appropriate laboratory screenings - CBC with Differential/Platelet - TSH - VITAMIN D 25  Hydroxy (Vit-D Deficiency, Fractures)   Reco Shonk Asad A. [facility] 12/23/2016 10:46 AM

## 2016-12-24 LAB — VITAMIN D 25 HYDROXY (VIT D DEFICIENCY, FRACTURES): Vit D, 25-Hydroxy: 26 ng/mL — ABNORMAL LOW (ref 30–100)

## 2016-12-25 ENCOUNTER — Telehealth: Payer: Self-pay

## 2016-12-25 MED ORDER — VITAMIN D (ERGOCALCIFEROL) 1.25 MG (50000 UNIT) PO CAPS: 50000.0000 [IU] | ORAL_CAPSULE | ORAL | 0 refills | Status: DC

## 2016-12-25 NOTE — Telephone Encounter (Signed)
Patient has been notified of lab results and a prescription for vitamin D3 50,000 units take 1 capsule once a week x12 weeks has been sent to [facility] per Dr. Manuella Ghazi, patient has been notified and verbalized understanding

## 2017-03-16 ENCOUNTER — Other Ambulatory Visit: Payer: Self-pay | Admitting: Family Medicine

## 2017-03-19 ENCOUNTER — Other Ambulatory Visit: Payer: Self-pay | Admitting: Family Medicine

## 2017-03-24 ENCOUNTER — Ambulatory Visit: Payer: BLUE CROSS/BLUE SHIELD | Admitting: Family Medicine

## 2017-05-24 ENCOUNTER — Other Ambulatory Visit: Payer: Self-pay | Admitting: Family Medicine

## 2017-05-24 DIAGNOSIS — I10 Essential (primary) hypertension: Secondary | ICD-10-CM

## 2017-05-28 ENCOUNTER — Other Ambulatory Visit: Payer: Self-pay

## 2017-05-28 DIAGNOSIS — I10 Essential (primary) hypertension: Secondary | ICD-10-CM

## 2017-05-28 MED ORDER — LISINOPRIL-HYDROCHLOROTHIAZIDE 10-12.5 MG PO TABS: 1.0000 | ORAL_TABLET | Freq: Every day | ORAL | 0 refills | Status: DC

## 2017-05-28 NOTE — Progress Notes (Signed)
Spoke to patient and informed her that her last visit was in July of this year and she will need to schedule an follow-up. Pt agreed to schedule an apt for 05/31/2017 at 9am. Pt stated her pharmacy is in [place] were she work and they are closed Monday and Tuesday. She will not be able to get it prescribed until wednesday. Sent in an emergency supply to her pharmacy to last her until she can get her medication refilled wednesday.

## 2017-05-31 ENCOUNTER — Encounter: Payer: Self-pay | Admitting: Family Medicine

## 2017-05-31 ENCOUNTER — Ambulatory Visit (INDEPENDENT_AMBULATORY_CARE_PROVIDER_SITE_OTHER): Payer: BLUE CROSS/BLUE SHIELD | Admitting: Family Medicine

## 2017-05-31 VITALS — BP 136/74 | HR 86 | Temp 98.4°F | Resp 14 | Wt 151.3 lb

## 2017-05-31 DIAGNOSIS — E559 Vitamin D deficiency, unspecified: Secondary | ICD-10-CM | POA: Diagnosis not present

## 2017-05-31 DIAGNOSIS — I10 Essential (primary) hypertension: Secondary | ICD-10-CM

## 2017-05-31 DIAGNOSIS — E7800 Pure hypercholesterolemia, unspecified: Secondary | ICD-10-CM | POA: Diagnosis not present

## 2017-05-31 MED ORDER — LISINOPRIL-HYDROCHLOROTHIAZIDE 10-12.5 MG PO TABS: 1.0000 | ORAL_TABLET | Freq: Every day | ORAL | 0 refills | Status: DC

## 2017-05-31 NOTE — Progress Notes (Signed)
Name: [name] [name]   MRN: [id number]    DOB: 1971-06-04   Date:05/31/2017       Progress Note  Subjective  Chief Complaint  Chief Complaint  Patient presents with  . Medication Refill    Hypertension  This is a chronic problem. The problem is unchanged. The problem is controlled. Pertinent negatives include no blurred vision, chest pain, headaches or palpitations. Past treatments include ACE inhibitors and diuretics. There is no history of kidney disease, CAD/MI or CVA.  Hyperlipidemia  This is a chronic problem. The problem is uncontrolled. Recent lipid tests were reviewed and are high. Pertinent negatives include no chest pain. Current antihyperlipidemic treatment includes bile acid squestrants and herbal therapy (taking OTC Fish oil.).   She was diagnosed with vitamin D insufficiency and has now taking 50,000 units 1 capsule weekly for 12 weeks, she would like to recheck her levels today.   Past Medical History:  Diagnosis Date  . Essential hypertension, benign     Past Surgical History:  Procedure Laterality Date  . CESAREAN SECTION    . TEAR DUCT PROBING      Family History  Problem Relation Age of Onset  . Hypertension Mother   . Hyperlipidemia Mother   . Hypertension Father     Social History   Socioeconomic History  . Marital status: Married    Spouse name: Not on file  . Number of children: Not on file  . Years of education: Not on file  . Highest education level: Not on file  Social Needs  . Financial resource strain: Not on file  . Food insecurity - worry: Not on file  . Food insecurity - inability: Not on file  . Transportation needs - medical: Not on file  . Transportation needs - non-medical: Not on file  Occupational History  . Not on file  Tobacco Use  . Smoking status: Never Smoker  . Smokeless tobacco: Never Used  Substance and Sexual Activity  . Alcohol use: No    Alcohol/week: 0.0 oz  . Drug use: No  . Sexual activity: Yes   Partners: Female  Other Topics Concern  . Not on file  Social History Narrative  . Not on file     Current Outpatient Medications:  .  lisinopril-hydrochlorothiazide (PRINZIDE,ZESTORETIC) 10-12.5 MG tablet, Take 1 tablet by mouth daily., Disp: 5 tablet, Rfl: 0 .  Omega-3 Fatty Acids (FISH OIL) 645 MG CAPS, Take by mouth., Disp: , Rfl:  .  Vitamin D, Ergocalciferol, (DRISDOL) 50000 units CAPS capsule, Take 1 capsule (50,000 Units total) by mouth once a week. For 12 weeks (Patient not taking: Reported on 05/31/2017), Disp: 12 capsule, Rfl: 0  Allergies  Allergen Reactions  . Zantac [Ranitidine Hcl]      Review of Systems  Eyes: Negative for blurred vision.  Cardiovascular: Negative for chest pain and palpitations.  Neurological: Negative for headaches.      Objective  Vitals:   05/31/17 0913  BP: 136/74  Pulse: 86  Resp: 14  Temp: 98.4 F (36.9 C)  TempSrc: Oral  SpO2: 98%  Weight: 151 lb 4.8 oz (68.6 kg)    Physical Exam  Constitutional: She is oriented to person, place, and time and well-developed, well-nourished, and in no distress.  HENT:  Head: Normocephalic and atraumatic.  Musculoskeletal: She exhibits no edema.  Neurological: She is alert and oriented to person, place, and time.  Psychiatric: Mood, memory, affect and judgment normal.  Nursing note and vitals reviewed.  Assessment & Plan  1. Essential hypertension Be stable on present anti-hypertensive treatment. - lisinopril-hydrochlorothiazide (PRINZIDE,ZESTORETIC) 10-12.5 MG tablet; Take 1 tablet by mouth daily.  Dispense: 90 tablet; Refill: 0  2. Pure hypercholesterolemia  - Lipid panel  3. Vitamin D insufficiency  - VITAMIN D 25 Hydroxy (Vit-D Deficiency, Fractures)   Elmon Shader [facility] 05/31/2017 9:30 AM

## 2017-06-01 LAB — LIPID PANEL
Cholesterol: 214 mg/dL — ABNORMAL HIGH (ref <200)
HDL: 49 mg/dL — ABNORMAL LOW (ref >50)
LDL Cholesterol (Calc): 144 mg/dL (calc) — ABNORMAL HIGH
Non-HDL Cholesterol (Calc): 165 mg/dL (calc) — ABNORMAL HIGH (ref <130)
Total CHOL/HDL Ratio: 4.4 (calc) (ref <5.0)
Triglycerides: 101 mg/dL (ref <150)

## 2017-06-01 LAB — VITAMIN D 25 HYDROXY (VIT D DEFICIENCY, FRACTURES): Vit D, 25-Hydroxy: 23 ng/mL — ABNORMAL LOW (ref 30–100)

## 2017-06-03 ENCOUNTER — Other Ambulatory Visit: Payer: Self-pay

## 2017-06-03 MED ORDER — VITAMIN D (ERGOCALCIFEROL) 1.25 MG (50000 UNIT) PO CAPS: 50000.0000 [IU] | ORAL_CAPSULE | ORAL | 0 refills | Status: DC

## 2017-08-30 ENCOUNTER — Other Ambulatory Visit: Payer: Self-pay

## 2017-08-30 DIAGNOSIS — I10 Essential (primary) hypertension: Secondary | ICD-10-CM

## 2017-08-30 NOTE — Telephone Encounter (Signed)
Hypertension medication request: Lisinopril-HCTZ to [facility].   Last office visit pertaining to hypertension: 05/31/2017   BP Readings from Last 3 Encounters:  05/31/17 136/74  12/23/16 120/77  11/03/16 118/66    Lab Results  Component Value Date   CREATININE 0.87 05/08/2016   BUN 11 05/08/2016   NA 137 05/08/2016   K 4.3 05/08/2016   CL 103 05/08/2016   CO2 27 05/08/2016     No follow-ups on file.

## 2017-08-31 ENCOUNTER — Other Ambulatory Visit: Payer: Self-pay | Admitting: Family Medicine

## 2017-08-31 DIAGNOSIS — I10 Essential (primary) hypertension: Secondary | ICD-10-CM

## 2017-08-31 NOTE — Telephone Encounter (Signed)
Hypertension medication request: Lisinopril-HCTZ to [facility].   Last office visit pertaining to hypertension: 05/31/2017   BP Readings from Last 3 Encounters:  05/31/17 136/74  12/23/16 120/77  11/03/16 118/66    Lab Results  Component Value Date   CREATININE 0.87 05/08/2016   BUN 11 05/08/2016   NA 137 05/08/2016   K 4.3 05/08/2016   CL 103 05/08/2016   CO2 27 05/08/2016     No follow-ups on file.

## 2017-08-31 NOTE — Telephone Encounter (Signed)
Copied from [place] [id number]. Topic: Quick Communication - Rx Refill/Question >> Aug 31, 2017 12:11 PM Oliver Pila B wrote: Medication: lisinopril-hydrochlorothiazide (PRINZIDE,ZESTORETIC) 10-12.5 MG tablet [[id number]]  Has the patient contacted their pharmacy? Yes.   (Agent: If no, request that the patient contact the pharmacy for the refill.) Preferred Pharmacy (with phone number or street name): [facility] Agent: Please be advised that RX refills may take up to 3 business days. We ask that you follow-up with your pharmacy.

## 2017-08-31 NOTE — Telephone Encounter (Signed)
Spoke with patient and she scheduled an appointment with Raquel Sarna because Dr Ancil Boozer is completely booked. She will be out of the medication by Friday and is asking that you please call in enough to last until she comes for her Monday appt.

## 2017-09-01 ENCOUNTER — Other Ambulatory Visit: Payer: Self-pay | Admitting: Family Medicine

## 2017-09-01 DIAGNOSIS — Z131 Encounter for screening for diabetes mellitus: Secondary | ICD-10-CM

## 2017-09-01 DIAGNOSIS — I10 Essential (primary) hypertension: Secondary | ICD-10-CM

## 2017-09-01 DIAGNOSIS — E7800 Pure hypercholesterolemia, unspecified: Secondary | ICD-10-CM

## 2017-09-01 NOTE — Telephone Encounter (Signed)
She does not have labs ( kidney and liver function in over one year). Please ask her to stop by to recheck cholesterol, comp panel and for screen diabetes.  I will send refills once labs are in  Orders printed and ready for her Thank you

## 2017-09-01 NOTE — Telephone Encounter (Signed)
Pt had scheduled an appt in April with Raquel Sarna however Raquel Sarna will not be in the office. I scheduled her for your first availability which is May 8. I am not able to get her in with Benjamine Mola due to the type insurance that she has. Pt is asking if you are able to give her enough medication until May 8. She will be completely out by this friday

## 2017-09-02 DIAGNOSIS — E7800 Pure hypercholesterolemia, unspecified: Secondary | ICD-10-CM | POA: Diagnosis not present

## 2017-09-02 DIAGNOSIS — I10 Essential (primary) hypertension: Secondary | ICD-10-CM | POA: Diagnosis not present

## 2017-09-02 DIAGNOSIS — Z131 Encounter for screening for diabetes mellitus: Secondary | ICD-10-CM | POA: Diagnosis not present

## 2017-09-02 MED ORDER — LISINOPRIL-HYDROCHLOROTHIAZIDE 10-12.5 MG PO TABS: 1.0000 | ORAL_TABLET | Freq: Every day | ORAL | 0 refills | Status: DC

## 2017-09-02 NOTE — Telephone Encounter (Signed)
Spoke with pt and she was informed that her prescription has been sent to the pharmacy

## 2017-09-02 NOTE — Telephone Encounter (Signed)
Melissa informed pt of Dr Ancil Boozer request. Pt stated that she will stop by Monday morning to do the labs, however she is asking that you just please call in 2 pills, enough to last until she can get here to get her labs done. She is asking that you please do not let her medication run out

## 2017-09-02 NOTE — Telephone Encounter (Signed)
Pt stopped by this morning and did her lab work

## 2017-09-03 LAB — COMPLETE METABOLIC PANEL WITH GFR
AG Ratio: 1.7 (calc) (ref 1.0–2.5)
ALT: 10 U/L (ref 6–29)
AST: 17 U/L (ref 10–35)
Albumin: 4.7 g/dL (ref 3.6–5.1)
Alkaline phosphatase (APISO): 54 U/L (ref 33–115)
BUN: 13 mg/dL (ref 7–25)
CO2: 25 mmol/L (ref 20–32)
Calcium: 9.6 mg/dL (ref 8.6–10.2)
Chloride: 103 mmol/L (ref 98–110)
Creat: 0.92 mg/dL (ref 0.50–1.10)
GFR, Est African American: 85 mL/min/{1.73_m2} (ref >=60)
GFR, Est Non African American: 74 mL/min/{1.73_m2} (ref >=60)
Globulin: 2.8 g/dL (calc) (ref 1.9–3.7)
Glucose, Bld: 79 mg/dL (ref 65–99)
Potassium: 4.2 mmol/L (ref 3.5–5.3)
Sodium: 137 mmol/L (ref 135–146)
Total Bilirubin: 0.3 mg/dL (ref 0.2–1.2)
Total Protein: 7.5 g/dL (ref 6.1–8.1)

## 2017-09-03 LAB — LIPID PANEL
Cholesterol: 210 mg/dL — ABNORMAL HIGH (ref <200)
HDL: 51 mg/dL (ref >50)
LDL Cholesterol (Calc): 140 mg/dL (calc) — ABNORMAL HIGH
Non-HDL Cholesterol (Calc): 159 mg/dL (calc) — ABNORMAL HIGH (ref <130)
Total CHOL/HDL Ratio: 4.1 (calc) (ref <5.0)
Triglycerides: 86 mg/dL (ref <150)

## 2017-09-03 LAB — HEMOGLOBIN A1C
Hgb A1c MFr Bld: 5.7 % of total Hgb — ABNORMAL HIGH (ref <5.7)
Mean Plasma Glucose: 117 (calc)
eAG (mmol/L): 6.5 (calc)

## 2017-09-06 ENCOUNTER — Ambulatory Visit: Payer: BLUE CROSS/BLUE SHIELD | Admitting: Family Medicine

## 2017-09-29 ENCOUNTER — Other Ambulatory Visit: Payer: Self-pay | Admitting: Family Medicine

## 2017-09-29 DIAGNOSIS — I10 Essential (primary) hypertension: Secondary | ICD-10-CM

## 2017-09-29 NOTE — Telephone Encounter (Signed)
Pt calling to check status of her lisinopril refill.

## 2017-09-29 NOTE — Telephone Encounter (Signed)
Hypertension medication request: Lisinopril-HCTZ to [facility].   Last office visit pertaining to hypertension: 05/31/2017   BP Readings from Last 3 Encounters:  05/31/17 136/74  12/23/16 120/77  11/03/16 118/66    Lab Results  Component Value Date   CREATININE 0.92 09/02/2017   BUN 13 09/02/2017   NA 137 09/02/2017   K 4.2 09/02/2017   CL 103 09/02/2017   CO2 25 09/02/2017     Follow up on 10/13/2017

## 2017-09-29 NOTE — Telephone Encounter (Signed)
FYI

## 2017-10-13 ENCOUNTER — Ambulatory Visit: Payer: BLUE CROSS/BLUE SHIELD | Admitting: Family Medicine

## 2017-10-13 ENCOUNTER — Encounter: Payer: Self-pay | Admitting: Family Medicine

## 2017-10-13 VITALS — BP 120/80 | HR 90 | Resp 16 | Ht 61.0 in | Wt 143.0 lb

## 2017-10-13 DIAGNOSIS — M545 Low back pain, unspecified: Secondary | ICD-10-CM | POA: Insufficient documentation

## 2017-10-13 DIAGNOSIS — R7303 Prediabetes: Secondary | ICD-10-CM | POA: Insufficient documentation

## 2017-10-13 DIAGNOSIS — E559 Vitamin D deficiency, unspecified: Secondary | ICD-10-CM | POA: Diagnosis not present

## 2017-10-13 DIAGNOSIS — E785 Hyperlipidemia, unspecified: Secondary | ICD-10-CM

## 2017-10-13 DIAGNOSIS — Z8632 Personal history of gestational diabetes: Secondary | ICD-10-CM

## 2017-10-13 DIAGNOSIS — E663 Overweight: Secondary | ICD-10-CM

## 2017-10-13 DIAGNOSIS — Z114 Encounter for screening for human immunodeficiency virus [HIV]: Secondary | ICD-10-CM | POA: Diagnosis not present

## 2017-10-13 DIAGNOSIS — I10 Essential (primary) hypertension: Secondary | ICD-10-CM | POA: Diagnosis not present

## 2017-10-13 DIAGNOSIS — Z23 Encounter for immunization: Secondary | ICD-10-CM | POA: Diagnosis not present

## 2017-10-13 DIAGNOSIS — N390 Urinary tract infection, site not specified: Secondary | ICD-10-CM | POA: Insufficient documentation

## 2017-10-13 LAB — HM PAP SMEAR: HM Pap smear: NEGATIVE

## 2017-10-13 MED ORDER — VITAMIN D 50 MCG (2000 UT) PO CAPS
1.0000 | ORAL_CAPSULE | Freq: Every day | ORAL | 0 refills | Status: DC
Start: 2017-10-13 — End: 2018-04-19

## 2017-10-13 MED ORDER — LISINOPRIL-HYDROCHLOROTHIAZIDE 10-12.5 MG PO TABS: 1.0000 | ORAL_TABLET | Freq: Every day | ORAL | 1 refills | Status: DC

## 2017-10-13 NOTE — Progress Notes (Signed)
Name: [name] [name]   MRN: [id number]    DOB: 1970-06-21   Date:10/13/2017       Progress Note  Subjective  Chief Complaint  Chief Complaint  Patient presents with  . Hypertension  . Prediabetes    HPI  Pre-diabetes: there is a family history of DM and she has a history of gestational diabetes, denies polyphagia, polydipsia or polyuria. Last hgbA1C was 5.7%   Overweight: she has changed her diet, eating more fruit and vegetables, cutting down on bread, and discussed increasing physical activity  HTN: she is compliant with medication, denies side effects of medication, bp is at goal. No chest pain or palpitation.   Dyslipidemia: discussed lipid panel done last visit   Patient Active Problem List   Diagnosis Date Noted  . Pre-diabetes 10/13/2017  . Hyperlipidemia 05/08/2016  . Essential hypertension 12/20/2014  . Obesity 12/20/2014  . Gastro-esophageal reflux disease without esophagitis 01/25/2010    Past Surgical History:  Procedure Laterality Date  . CESAREAN SECTION    . TEAR DUCT PROBING      Family History  Problem Relation Age of Onset  . Hypertension Mother   . Hyperlipidemia Mother   . Hypertension Father     Social History   Socioeconomic History  . Marital status: Married    Spouse name: Audry Pili   . Number of children: 2  . Years of education: Not on file  . Highest education level: Master's degree (e.g., MA, MS, MEng, MEd, MSW, MBA)  Occupational History  . Occupation: Patent attorney    Comment: therapist and supervisor   Social Needs  . Financial resource strain: Not on file  . Food insecurity:    Worry: Not on file    Inability: Not on file  . Transportation needs:    Medical: Not on file    Non-medical: Not on file  Tobacco Use  . Smoking status: Never Smoker  . Smokeless tobacco: Never Used  Substance and Sexual Activity  . Alcohol use: Yes    Alcohol/week: 0.6 oz    Types: 1 Glasses of wine per week    Comment: seldom   every 4-5 months  . Drug use: No  . Sexual activity: Yes    Partners: Female    Birth control/protection: Surgical  Lifestyle  . Physical activity:    Days per week: 0 days    Minutes per session: 0 min  . Stress: Not at all  Relationships  . Social connections:    Talks on phone: More than three times a week    Gets together: Twice a week    Attends religious service: More than 4 times per year    Active member of club or organization: Yes    Attends meetings of clubs or organizations: More than 4 times per year    Relationship status: Married  . Intimate partner violence:    Fear of current or ex partner: No    Emotionally abused: No    Physically abused: No    Forced sexual activity: No  Other Topics Concern  . Not on file  Social History Narrative   Two boys, 14 years apart      Current Outpatient Medications:  .  lisinopril-hydrochlorothiazide (PRINZIDE,ZESTORETIC) 10-12.5 MG tablet, TAKE 1 TABLET BY MOUTH DAILY., Disp: 30 tablet, Rfl: 0 .  Omega-3 Fatty Acids (FISH OIL) 645 MG CAPS, Take by mouth., Disp: , Rfl:  .  Vitamin D, Ergocalciferol, (DRISDOL) 50000 units CAPS capsule,  Take 1 capsule (50,000 Units total) by mouth once a week. For 12 weeks, Disp: 12 capsule, Rfl: 0  Allergies  Allergen Reactions  . Zantac [Ranitidine Hcl]      ROS  Constitutional: Negative for fever , positive for  weight change.  Respiratory: Negative for cough and shortness of breath.   Cardiovascular: Negative for chest pain or palpitations.  Gastrointestinal: Negative for abdominal pain, no bowel changes.  Musculoskeletal: Negative for gait problem or joint swelling.  Skin: Negative for rash.  Neurological: Negative for dizziness or headache.  No other specific complaints in a complete review of systems (except as listed in HPI above).  Objective  Vitals:   10/13/17 1146  BP: 120/80  Pulse: 90  Resp: 16  SpO2: 99%  Weight: 143 lb (64.9 kg)  Height: 5\' 1"  (1.549 m)     Body mass index is 27.02 kg/m.  Physical Exam  Constitutional: Patient appears well-developed and well-nourished. No distress.  HEENT: head atraumatic, normocephalic, pupils equal and reactive to light,  neck supple, throat within normal limits Cardiovascular: Normal rate, regular rhythm and normal heart sounds.  No murmur heard. No BLE edema. Pulmonary/Chest: Effort normal and breath sounds normal. No respiratory distress. Abdominal: Soft.  There is no tenderness. Psychiatric: Patient has a normal mood and affect. behavior is normal. Judgment and thought content normal.  Recent Results (from the past 2160 hour(s))  Hemoglobin A1c     Status: Abnormal   Collection Time: 09/02/17 12:00 AM  Result Value Ref Range   Hgb A1c MFr Bld 5.7 (H) <5.7 % of total Hgb    Comment: For someone without known diabetes, a hemoglobin  A1c value between 5.7% and 6.4% is consistent with prediabetes and should be confirmed with a  follow-up test. . For someone with known diabetes, a value <7% indicates that their diabetes is well controlled. A1c targets should be individualized based on duration of diabetes, age, comorbid conditions, and other considerations. . This assay result is consistent with an increased risk of diabetes. . Currently, no consensus exists regarding use of hemoglobin A1c for diagnosis of diabetes for children. .    Mean Plasma Glucose 117 (calc)   eAG (mmol/L) 6.5 (calc)  COMPLETE METABOLIC PANEL WITH GFR     Status: None   Collection Time: 09/02/17 12:00 AM  Result Value Ref Range   Glucose, Bld 79 65 - 99 mg/dL    Comment: .            Fasting reference interval .    BUN 13 7 - 25 mg/dL   Creat 0.92 0.50 - 1.10 mg/dL   GFR, Est Non African American 74 > OR = 60 mL/min/1.39m2   GFR, Est African American 85 > OR = 60 mL/min/1.9m2   BUN/Creatinine Ratio NOT APPLICABLE 6 - 22 (calc)   Sodium 137 135 - 146 mmol/L   Potassium 4.2 3.5 - 5.3 mmol/L   Chloride 103  98 - 110 mmol/L   CO2 25 20 - 32 mmol/L   Calcium 9.6 8.6 - 10.2 mg/dL   Total Protein 7.5 6.1 - 8.1 g/dL   Albumin 4.7 3.6 - 5.1 g/dL   Globulin 2.8 1.9 - 3.7 g/dL (calc)   AG Ratio 1.7 1.0 - 2.5 (calc)   Total Bilirubin 0.3 0.2 - 1.2 mg/dL   Alkaline phosphatase (APISO) 54 33 - 115 U/L   AST 17 10 - 35 U/L   ALT 10 6 - 29 U/L  Lipid panel  Status: Abnormal   Collection Time: 09/02/17 12:00 AM  Result Value Ref Range   Cholesterol 210 (H) <200 mg/dL   HDL 51 >50 mg/dL   Triglycerides 86 <150 mg/dL   LDL Cholesterol (Calc) 140 (H) mg/dL (calc)    Comment: Reference range: <100 . Desirable range <100 mg/dL for primary prevention;   <70 mg/dL for patients with CHD or diabetic patients  with > or = 2 CHD risk factors. Marland Kitchen LDL-C is now calculated using the Martin-Hopkins  calculation, which is a validated novel method providing  better accuracy than the Friedewald equation in the  estimation of LDL-C.  Cresenciano Genre et al. Annamaria Helling. 6270;350(09): 2061-2068  (http://education.[facility].com/faq/FAQ164)    Total CHOL/HDL Ratio 4.1 <5.0 (calc)   Non-HDL Cholesterol (Calc) 159 (H) <130 mg/dL (calc)    Comment: For patients with diabetes plus 1 major ASCVD risk  factor, treating to a non-HDL-C goal of <100 mg/dL  (LDL-C of <70 mg/dL) is considered a therapeutic  option.      PHQ2/9: Depression screen [facility] 2/9 05/31/2017 12/23/2016 11/03/2016 11/07/2015 09/26/2015  Decreased Interest 0 0 0 0 0  Down, Depressed, Hopeless 0 0 0 0 0  PHQ - 2 Score 0 0 0 0 0     Fall Risk: Fall Risk  10/13/2017 05/31/2017 12/23/2016 11/03/2016 11/07/2015  Falls in the past year? No No No No No     Functional Status Survey: Is the patient deaf or have difficulty hearing?: No Does the patient have difficulty seeing, even when wearing glasses/contacts?: No Does the patient have difficulty concentrating, remembering, or making decisions?: No Does the patient have difficulty walking or climbing stairs?:  No Does the patient have difficulty dressing or bathing?: No Does the patient have difficulty doing errands alone such as visiting a doctor's office or shopping?: No    Assessment & Plan  1. Essential hypertension  - EKG 12-Lead - lisinopril-hydrochlorothiazide (PRINZIDE,ZESTORETIC) 10-12.5 MG tablet; Take 1 tablet by mouth daily.  Dispense: 90 tablet; Refill: 1  2. Pre-diabetes  Doing well with life style modification   3. Encounter for screening for HIV  Had a child 7 years ago   78. Vitamin D insufficiency  - Cholecalciferol (VITAMIN D) 2000 units CAPS; Take 1 capsule (2,000 Units total) by mouth daily.  Dispense: 30 capsule; Refill: 0  5. Dyslipidemia  ASCVD is low, no need for statins at this time  6. Overweight (BMI 25.0-29.9)  Losing weight, advised to start physical activity   7. Need for Tdap vaccination  - Tdap vaccine greater than or equal to 7yo IM

## 2017-10-14 ENCOUNTER — Encounter: Payer: Self-pay | Admitting: Family Medicine

## 2017-11-05 DIAGNOSIS — M546 Pain in thoracic spine: Secondary | ICD-10-CM | POA: Diagnosis not present

## 2017-11-05 DIAGNOSIS — M9902 Segmental and somatic dysfunction of thoracic region: Secondary | ICD-10-CM | POA: Diagnosis not present

## 2017-11-05 DIAGNOSIS — M5136 Other intervertebral disc degeneration, lumbar region: Secondary | ICD-10-CM | POA: Diagnosis not present

## 2017-11-05 DIAGNOSIS — M9903 Segmental and somatic dysfunction of lumbar region: Secondary | ICD-10-CM | POA: Diagnosis not present

## 2018-04-15 ENCOUNTER — Encounter: Payer: Self-pay | Admitting: Family Medicine

## 2018-04-15 ENCOUNTER — Ambulatory Visit: Payer: BLUE CROSS/BLUE SHIELD | Admitting: Family Medicine

## 2018-04-15 VITALS — BP 112/68 | HR 104 | Temp 98.4°F | Resp 16 | Ht 61.0 in | Wt 145.7 lb

## 2018-04-15 DIAGNOSIS — E785 Hyperlipidemia, unspecified: Secondary | ICD-10-CM | POA: Diagnosis not present

## 2018-04-15 DIAGNOSIS — G8929 Other chronic pain: Secondary | ICD-10-CM

## 2018-04-15 DIAGNOSIS — Z1239 Encounter for other screening for malignant neoplasm of breast: Secondary | ICD-10-CM

## 2018-04-15 DIAGNOSIS — E663 Overweight: Secondary | ICD-10-CM

## 2018-04-15 DIAGNOSIS — R7303 Prediabetes: Secondary | ICD-10-CM | POA: Diagnosis not present

## 2018-04-15 DIAGNOSIS — Z1211 Encounter for screening for malignant neoplasm of colon: Secondary | ICD-10-CM

## 2018-04-15 DIAGNOSIS — I10 Essential (primary) hypertension: Secondary | ICD-10-CM

## 2018-04-15 DIAGNOSIS — Z8632 Personal history of gestational diabetes: Secondary | ICD-10-CM

## 2018-04-15 DIAGNOSIS — E559 Vitamin D deficiency, unspecified: Secondary | ICD-10-CM

## 2018-04-15 DIAGNOSIS — M5441 Lumbago with sciatica, right side: Secondary | ICD-10-CM

## 2018-04-15 DIAGNOSIS — Z862 Personal history of diseases of the blood and blood-forming organs and certain disorders involving the immune mechanism: Secondary | ICD-10-CM | POA: Diagnosis not present

## 2018-04-15 MED ORDER — LISINOPRIL-HYDROCHLOROTHIAZIDE 10-12.5 MG PO TABS: 1.0000 | ORAL_TABLET | Freq: Every day | ORAL | 3 refills | Status: DC

## 2018-04-15 NOTE — Progress Notes (Signed)
Name: [name] [name]   MRN: [id number]    DOB: 30-Jul-1970   Date:04/15/2018       Progress Note  Subjective  Chief Complaint  Chief Complaint  Patient presents with  . Follow-up    6 mth f/u  . Hypertension  . Dyslipidemia  . Obesity    HPI  Pre-diabetes: there is a family history of DM and she has a personal  history of gestational diabetes, denies polyphagia, polydipsia or polyuria. Last hgbA1C was 5.7%  She has been trying to follow a diabetic diet   Overweight: she has changed her diet, eating more fruit and vegetables, cutting down on bread, eating fish at least twice a week.   HTN: she is compliant with medication, denies side effects of medication, bp is towards low end of normal, but she is not having any symptoms such as dizziness or fatigue, we will continue current regiment and monitor  Dyslipidemia: discussed lipid panel done last visit, and we will recheck levels, and consider statin therapy depending on ASCVD risk  Chronic low back pain: seeing chiropractor - Dr. Freddi Che weekly. She states at the end of the day pain is mid back and 7/10, improves with stretching, advised to avoid nsaid's and try tylenol instead, Right now pain is 0/10  Anemia found on labs done last year, she states cycles are heavy for 3 days but lasts 7 days, she is finally skipping some cycles, denies pica, no SOB or dizziness. We will recheck labs today  Vitamin D def: she has been out of supplementation at this time, she states if labs still low, she would prefer to get rx vitamin D weekly    Patient Active Problem List   Diagnosis Date Noted  . Pre-diabetes 10/13/2017  . Intermittent low back pain 10/13/2017  . History of gestational diabetes 10/13/2017  . Hyperlipidemia 05/08/2016  . Essential hypertension 12/20/2014  . Obesity 12/20/2014  . Gastro-esophageal reflux disease without esophagitis 01/25/2010    Past Surgical History:  Procedure Laterality Date  . CESAREAN SECTION     . TEAR DUCT PROBING      Family History  Problem Relation Age of Onset  . Hypertension Mother   . Hyperlipidemia Mother   . Hypertension Father     Social History   Socioeconomic History  . Marital status: Married    Spouse name: Audry Pili   . Number of children: 2  . Years of education: Not on file  . Highest education level: Master's degree (e.g., MA, MS, MEng, MEd, MSW, MBA)  Occupational History  . Occupation: Patent attorney    Comment: therapist and supervisor   Social Needs  . Financial resource strain: Not hard at all  . Food insecurity:    Worry: Never true    Inability: Never true  . Transportation needs:    Medical: No    Non-medical: No  Tobacco Use  . Smoking status: Never Smoker  . Smokeless tobacco: Never Used  Substance and Sexual Activity  . Alcohol use: Yes    Alcohol/week: 1.0 standard drinks    Types: 1 Glasses of wine per week    Comment: seldom  every 4-5 months  . Drug use: No  . Sexual activity: Yes    Partners: Male    Birth control/protection: Surgical  Lifestyle  . Physical activity:    Days per week: 0 days    Minutes per session: 0 min  . Stress: Not at all  Relationships  .  Social connections:    Talks on phone: More than three times a week    Gets together: Twice a week    Attends religious service: More than 4 times per year    Active member of club or organization: Yes    Attends meetings of clubs or organizations: More than 4 times per year    Relationship status: Married  . Intimate partner violence:    Fear of current or ex partner: No    Emotionally abused: No    Physically abused: No    Forced sexual activity: No  Other Topics Concern  . Not on file  Social History Narrative   Two boys, 14 years apart      Current Outpatient Medications:  .  Cholecalciferol (VITAMIN D) 2000 units CAPS, Take 1 capsule (2,000 Units total) by mouth daily., Disp: 30 capsule, Rfl: 0 .  lisinopril-hydrochlorothiazide  (PRINZIDE,ZESTORETIC) 10-12.5 MG tablet, Take 1 tablet by mouth daily., Disp: 90 tablet, Rfl: 3 .  Omega-3 Fatty Acids (FISH OIL) 645 MG CAPS, Take by mouth., Disp: , Rfl:   Allergies  Allergen Reactions  . Zantac [Ranitidine Hcl]     I personally reviewed active problem list, medication list, allergies, family history, social history with the patient/caregiver today.   ROS  Constitutional: Negative for fever or weight change.  Respiratory: Negative for cough and shortness of breath.   Cardiovascular: Negative for chest pain or palpitations.  Gastrointestinal: Negative for abdominal pain, no bowel changes.  Musculoskeletal: Negative for gait problem or joint swelling.  Skin: Negative for rash.  Neurological: Negative for dizziness or headache.  No other specific complaints in a complete review of systems (except as listed in HPI above).  Objective  Vitals:   04/15/18 0750 04/15/18 0758  BP: 136/82 112/68  Pulse: (!) 104   Resp: 16   Temp: 98.4 F (36.9 C)   TempSrc: Oral   SpO2: 99%   Weight: 145 lb 11.2 oz (66.1 kg)   Height: 5\' 1"  (1.549 m)     Body mass index is 27.53 kg/m.  Physical Exam  Constitutional: Patient appears well-developed and well-nourished. Overweight. No distress.  HEENT: head atraumatic, normocephalic, pupils equal and reactive to light, neck supple, throat within normal limits Cardiovascular: Normal rate, regular rhythm and normal heart sounds.  No murmur heard. No BLE edema. Pulmonary/Chest: Effort normal and breath sounds normal. No respiratory distress. Abdominal: Soft.  There is no tenderness. Muscular skeletal: no pain during palpation of lumbar spine, negative straight leg raise  Psychiatric: Patient has a normal mood and affect. behavior is normal. Judgment and thought content normal.  PHQ2/9: Depression screen [facility] 2/9 04/15/2018 05/31/2017 12/23/2016 11/03/2016 11/07/2015  Decreased Interest 0 0 0 0 0  Down, Depressed, Hopeless 0 0 0 0 0   PHQ - 2 Score 0 0 0 0 0  Altered sleeping 0 - - - -  Tired, decreased energy 0 - - - -  Change in appetite 0 - - - -  Feeling bad or failure about yourself  0 - - - -  Trouble concentrating 0 - - - -  Moving slowly or fidgety/restless 0 - - - -  Suicidal thoughts 0 - - - -  PHQ-9 Score 0 - - - -  Difficult doing work/chores Not difficult at all - - - -    Fall Risk: Fall Risk  04/15/2018 10/13/2017 05/31/2017 12/23/2016 11/03/2016  Falls in the past year? 0 No No No No  Functional Status Survey: Is the patient deaf or have difficulty hearing?: No Does the patient have difficulty seeing, even when wearing glasses/contacts?: No Does the patient have difficulty concentrating, remembering, or making decisions?: No Does the patient have difficulty walking or climbing stairs?: No Does the patient have difficulty dressing or bathing?: No Does the patient have difficulty doing errands alone such as visiting a doctor's office or shopping?: No   Assessment & Plan  1. Essential hypertension  - lisinopril-hydrochlorothiazide (PRINZIDE,ZESTORETIC) 10-12.5 MG tablet; Take 1 tablet by mouth daily.  Dispense: 90 tablet; Refill: 3 - COMPLETE METABOLIC PANEL WITH GFR  2. Breast cancer screening  - MM DIGITAL SCREENING BILATERAL; Future  3. Colon cancer screening  - Ambulatory referral to Gastroenterology  4. History of anemia  - CBC with Differential/Platelet - Vitamin B12 - Iron, TIBC and Ferritin Panel  5. Vitamin D insufficiency  - VITAMIN D 25 Hydroxy (Vit-D Deficiency, Fractures)  6. Dyslipidemia  - Lipid panel  7. Pre-diabetes  - Hemoglobin A1c  8. Chronic right-sided low back pain with right-sided sciatica  Seeing chiropractor - Dr. Freddi Che  9. History of gestational diabetes   10. Overweight (BMI 25.0-29.9)  She is making healthy choices

## 2018-04-16 LAB — COMPLETE METABOLIC PANEL WITH GFR
AG Ratio: 1.8 (calc) (ref 1.0–2.5)
ALT: 11 U/L (ref 6–29)
AST: 17 U/L (ref 10–35)
Albumin: 4.9 g/dL (ref 3.6–5.1)
Alkaline phosphatase (APISO): 56 U/L (ref 33–115)
BUN: 10 mg/dL (ref 7–25)
CO2: 28 mmol/L (ref 20–32)
Calcium: 9.9 mg/dL (ref 8.6–10.2)
Chloride: 100 mmol/L (ref 98–110)
Creat: 0.8 mg/dL (ref 0.50–1.10)
GFR, Est African American: 100 mL/min/{1.73_m2} (ref >=60)
GFR, Est Non African American: 87 mL/min/{1.73_m2} (ref >=60)
Globulin: 2.7 g/dL (calc) (ref 1.9–3.7)
Glucose, Bld: 82 mg/dL (ref 65–99)
Potassium: 4.2 mmol/L (ref 3.5–5.3)
Sodium: 138 mmol/L (ref 135–146)
Total Bilirubin: 0.3 mg/dL (ref 0.2–1.2)
Total Protein: 7.6 g/dL (ref 6.1–8.1)

## 2018-04-16 LAB — IRON,TIBC AND FERRITIN PANEL
%SAT: 3 % (calc) — ABNORMAL LOW (ref 16–45)
Ferritin: 1 ng/mL — ABNORMAL LOW (ref 16–232)
Iron: 16 ug/dL — ABNORMAL LOW (ref 40–190)
TIBC: 574 mcg/dL (calc) — ABNORMAL HIGH (ref 250–450)

## 2018-04-16 LAB — CBC WITH DIFFERENTIAL/PLATELET
Basophils Absolute: 74 cells/uL (ref 0–200)
Basophils Relative: 1.1 %
Eosinophils Absolute: 174 cells/uL (ref 15–500)
Eosinophils Relative: 2.6 %
HCT: 31 % — ABNORMAL LOW (ref 35.0–45.0)
Hemoglobin: 9 g/dL — ABNORMAL LOW (ref 11.7–15.5)
Lymphs Abs: 1139 cells/uL (ref 850–3900)
MCH: 19.8 pg — ABNORMAL LOW (ref 27.0–33.0)
MCHC: 29 g/dL — ABNORMAL LOW (ref 32.0–36.0)
MCV: 68.3 fL — ABNORMAL LOW (ref 80.0–100.0)
MPV: 10.2 fL (ref 7.5–12.5)
Monocytes Relative: 8.1 %
Neutro Abs: 4770 cells/uL (ref 1500–7800)
Neutrophils Relative %: 71.2 %
Platelets: 541 10*3/uL — ABNORMAL HIGH (ref 140–400)
RBC: 4.54 10*6/uL (ref 3.80–5.10)
RDW: 16.6 % — ABNORMAL HIGH (ref 11.0–15.0)
Total Lymphocyte: 17 %
WBC mixed population: 543 cells/uL (ref 200–950)
WBC: 6.7 10*3/uL (ref 3.8–10.8)

## 2018-04-16 LAB — LIPID PANEL
Cholesterol: 205 mg/dL — ABNORMAL HIGH (ref <200)
HDL: 50 mg/dL — ABNORMAL LOW (ref >50)
LDL Cholesterol (Calc): 135 mg/dL (calc) — ABNORMAL HIGH
Non-HDL Cholesterol (Calc): 155 mg/dL (calc) — ABNORMAL HIGH (ref <130)
Total CHOL/HDL Ratio: 4.1 (calc) (ref <5.0)
Triglycerides: 96 mg/dL (ref <150)

## 2018-04-16 LAB — HEMOGLOBIN A1C
Hgb A1c MFr Bld: 5.6 % of total Hgb (ref <5.7)
Mean Plasma Glucose: 114 (calc)
eAG (mmol/L): 6.3 (calc)

## 2018-04-16 LAB — VITAMIN B12: Vitamin B-12: 657 pg/mL (ref 200–1100)

## 2018-04-16 LAB — VITAMIN D 25 HYDROXY (VIT D DEFICIENCY, FRACTURES): Vit D, 25-Hydroxy: 27 ng/mL — ABNORMAL LOW (ref 30–100)

## 2018-04-19 ENCOUNTER — Other Ambulatory Visit: Payer: Self-pay | Admitting: Family Medicine

## 2018-04-19 MED ORDER — VITAMIN D (ERGOCALCIFEROL) 1.25 MG (50000 UNIT) PO CAPS: 50000.0000 [IU] | ORAL_CAPSULE | ORAL | 1 refills | Status: DC

## 2018-04-19 MED ORDER — DOCUSATE SODIUM 100 MG PO CAPS: 100.0000 mg | ORAL_CAPSULE | Freq: Two times a day (BID) | ORAL | 1 refills | Status: DC

## 2018-04-19 MED ORDER — FERROUS SULFATE 325 (65 FE) MG PO TABS: 325.0000 mg | ORAL_TABLET | Freq: Two times a day (BID) | ORAL | 1 refills | Status: DC

## 2018-06-09 ENCOUNTER — Ambulatory Visit: Payer: Self-pay | Admitting: Gastroenterology

## 2018-06-13 ENCOUNTER — Ambulatory Visit (INDEPENDENT_AMBULATORY_CARE_PROVIDER_SITE_OTHER): Payer: BLUE CROSS/BLUE SHIELD | Admitting: Gastroenterology

## 2018-06-13 ENCOUNTER — Encounter: Payer: Self-pay | Admitting: Gastroenterology

## 2018-06-13 VITALS — BP 100/68 | HR 99 | Ht 61.0 in | Wt 147.8 lb

## 2018-06-13 DIAGNOSIS — D508 Other iron deficiency anemias: Secondary | ICD-10-CM | POA: Diagnosis not present

## 2018-06-13 NOTE — Progress Notes (Signed)
[name] [name] [address]  [place]  [place], [place] [address]  Main: [phone]  Fax: [phone]   Gastroenterology Consultation  Referring Provider:     Steele Sizer, MD Primary Care Physician:  Steele Sizer, MD Primary Gastroenterologist:  Dr. Vonda [name] Reason for Consultation:     Iron deficiency anemia        HPI:    Chief Complaint  Patient presents with  . New Patient (Initial Visit)    referral from Dr. Ancil Boozer for colon cancer screening    LINNET [name] is a 48 y.o. y/o female referred for consultation & management  by Dr. Ancil Boozer, Drue Stager, MD.  Patient referred for evaluation of colorectal cancer screening, and also found to be anemic.  No signs of active GI bleeding or any other sources of bleeding.  Blood work shows severe iron deficiency anemia with ferritin of 1.  Hemoglobin 9.0, with low MCV at 68.  Is on oral iron replacement per primary care doctor.  PCP note states patient has periods for 7 days with first 3 days being heavy.  However, has recently started to skip periods and states she is premenopausal.    Has never had an EGD or colonoscopy.  No immediate family history of colon cancer.  The patient denies abdominal or flank pain, anorexia, nausea or vomiting, dysphagia, change in bowel habits or black or bloody stools or weight loss.  Past Medical History:  Diagnosis Date  . Essential hypertension, benign     Past Surgical History:  Procedure Laterality Date  . CESAREAN SECTION    . TEAR DUCT PROBING      Prior to Admission medications   Medication Sig Start Date End Date Taking? Authorizing Provider  docusate sodium (COLACE) 100 MG capsule Take 1 capsule (100 mg total) by mouth 2 (two) times daily. With iron pills for constipation 04/19/18  Yes Sowles, Drue Stager, MD  ferrous sulfate 325 (65 FE) MG tablet Take 1 tablet (325 mg total) by mouth 2 (two) times daily with a meal. 04/19/18  Yes Sowles, Drue Stager, MD    lisinopril-hydrochlorothiazide (PRINZIDE,ZESTORETIC) 10-12.5 MG tablet Take 1 tablet by mouth daily. 04/15/18  Yes Sowles, Drue Stager, MD  Omega-3 Fatty Acids (FISH OIL) 645 MG CAPS Take by mouth.   Yes [provider]  Vitamin D, Ergocalciferol, (DRISDOL) 1.25 MG (50000 UT) CAPS capsule Take 1 capsule (50,000 Units total) by mouth every 7 (seven) days. 04/19/18  Yes Steele Sizer, MD    Family History  Problem Relation Age of Onset  . Hypertension Mother   . Hyperlipidemia Mother   . Hypertension Father      Social History   Tobacco Use  . Smoking status: Never Smoker  . Smokeless tobacco: Never Used  Substance Use Topics  . Alcohol use: Yes    Alcohol/week: 1.0 standard drinks    Types: 1 Glasses of wine per week    Comment: seldom  every 4-5 months  . Drug use: No    Allergies as of 06/13/2018 - Review Complete 06/13/2018  Allergen Reaction Noted  . Zantac [ranitidine hcl]  09/26/2014    Review of Systems:    All systems reviewed and negative except where noted in HPI.   Physical Exam:  BP 100/68   Pulse 99   Ht 5\' 1"  (1.549 m)   Wt 147 lb 12.8 oz (67 kg)   BMI 27.93 kg/m  No LMP recorded. Psych:  Alert and cooperative. Normal mood and affect. General:  Alert,  Well-developed, well-nourished, pleasant and cooperative in NAD Head:  Normocephalic and atraumatic. Eyes:  Sclera clear, no icterus.   Conjunctiva pink. Ears:  Normal auditory acuity. Nose:  No deformity, discharge, or lesions. Mouth:  No deformity or lesions,oropharynx pink & moist. Neck:  Supple; no masses or thyromegaly. Abdomen:  Normal bowel sounds.  No bruits.  Soft, non-tender and non-distended without masses, hepatosplenomegaly or hernias noted.  No guarding or rebound tenderness.    Msk:  Symmetrical without gross deformities. Good, equal movement & strength bilaterally. Pulses:  Normal pulses noted. Extremities:  No clubbing or edema.  No cyanosis. Neurologic:  Alert and oriented x3;   grossly normal neurologically. Skin:  Intact without significant lesions or rashes. No jaundice. Lymph Nodes:  No significant cervical adenopathy. Psych:  Alert and cooperative. Normal mood and affect.   Labs: CBC    Component Value Date/Time   WBC 6.7 04/15/2018 0820   RBC 4.54 04/15/2018 0820   HGB 9.0 (L) 04/15/2018 0820   HCT 31.0 (L) 04/15/2018 0820   PLT 541 (H) 04/15/2018 0820   MCV 68.3 (L) 04/15/2018 0820   MCH 19.8 (L) 04/15/2018 0820   MCHC 29.0 (L) 04/15/2018 0820   RDW 16.6 (H) 04/15/2018 0820   LYMPHSABS 1,139 04/15/2018 0820   MONOABS 490 12/23/2016 1111   EOSABS 174 04/15/2018 0820   BASOSABS 74 04/15/2018 0820   CMP     Component Value Date/Time   NA 138 04/15/2018 0820   K 4.2 04/15/2018 0820   CL 100 04/15/2018 0820   CO2 28 04/15/2018 0820   GLUCOSE 82 04/15/2018 0820   BUN 10 04/15/2018 0820   CREATININE 0.80 04/15/2018 0820   CALCIUM 9.9 04/15/2018 0820   PROT 7.6 04/15/2018 0820   ALBUMIN 4.2 05/08/2016 0844   AST 17 04/15/2018 0820   ALT 11 04/15/2018 0820   ALKPHOS 50 05/08/2016 0844   BILITOT 0.3 04/15/2018 0820   GFRNONAA 87 04/15/2018 0820   GFRAA 100 04/15/2018 0820    Imaging Studies: No results found.  Assessment and Plan:   [name] [name] is a 48 y.o. y/o female has been referred for iron deficiency anemia   Colonoscopy and EGD indicated for evaluation of iron deficiency anemia and if this is negative small bowel capsule is needed as well Will need to evaluate for source of iron deficiency anemia which could include peptic ulcer disease, AVMs, malignancy  We will also refer patient to hematology to evaluate for need for IV iron transfusion.  Continue oral iron in the meantime Patient also encouraged to follow-up with GYN to discuss her menstrual cycle and she verbalized understanding.  She states she is planning on making an appointment with them soon.  However, given that she is possibly premenopausal, her anemia should not  be getting worse from her periods as they are less now than they were before.  I have discussed alternative options, risks & benefits,  which include, but are not limited to, bleeding, infection, perforation,respiratory complication & drug reaction.  The patient agrees with this plan & written consent will be obtained.      Dr [name] [name]  Speech recognition software was used to dictate the above note.

## 2018-06-13 NOTE — Addendum Note (Signed)
Addended by: Earl Lagos on: 06/13/2018 03:37 PM   Modules accepted: Orders

## 2018-06-13 NOTE — Patient Instructions (Signed)
Referral to Hematology for Iron Deficiency Anemia-they will contact you from the [place] at [facility]. F/U 3 months.

## 2018-06-14 ENCOUNTER — Other Ambulatory Visit: Payer: Self-pay

## 2018-06-14 DIAGNOSIS — D508 Other iron deficiency anemias: Secondary | ICD-10-CM

## 2018-06-16 NOTE — Addendum Note (Signed)
Addended by: Earl Lagos on: 06/16/2018 04:20 PM   Modules accepted: Orders

## 2018-07-07 ENCOUNTER — Encounter: Payer: Self-pay | Admitting: Anesthesiology

## 2018-07-08 ENCOUNTER — Ambulatory Visit: Payer: BLUE CROSS/BLUE SHIELD | Admitting: Anesthesiology

## 2018-07-08 ENCOUNTER — Ambulatory Visit
Admission: RE | Admit: 2018-07-08 | Discharge: 2018-07-08 | Disposition: A | Discharge status: Home / Self Care | Payer: BLUE CROSS/BLUE SHIELD | Attending: Gastroenterology | Admitting: Gastroenterology

## 2018-07-08 ENCOUNTER — Encounter
Admission: RE | Disposition: A | Discharge status: Home / Self Care | Payer: Self-pay | Source: Home / Self Care | Attending: Gastroenterology

## 2018-07-08 DIAGNOSIS — I10 Essential (primary) hypertension: Secondary | ICD-10-CM | POA: Insufficient documentation

## 2018-07-08 DIAGNOSIS — Z6827 Body mass index (BMI) 27.0-27.9, adult: Secondary | ICD-10-CM | POA: Diagnosis not present

## 2018-07-08 DIAGNOSIS — Z79899 Other long term (current) drug therapy: Secondary | ICD-10-CM | POA: Diagnosis not present

## 2018-07-08 DIAGNOSIS — D128 Benign neoplasm of rectum: Secondary | ICD-10-CM | POA: Diagnosis not present

## 2018-07-08 DIAGNOSIS — K449 Diaphragmatic hernia without obstruction or gangrene: Secondary | ICD-10-CM | POA: Insufficient documentation

## 2018-07-08 DIAGNOSIS — K2980 Duodenitis without bleeding: Secondary | ICD-10-CM | POA: Diagnosis not present

## 2018-07-08 DIAGNOSIS — Z1211 Encounter for screening for malignant neoplasm of colon: Secondary | ICD-10-CM

## 2018-07-08 DIAGNOSIS — E669 Obesity, unspecified: Secondary | ICD-10-CM | POA: Insufficient documentation

## 2018-07-08 DIAGNOSIS — K5730 Diverticulosis of large intestine without perforation or abscess without bleeding: Secondary | ICD-10-CM

## 2018-07-08 DIAGNOSIS — K219 Gastro-esophageal reflux disease without esophagitis: Secondary | ICD-10-CM | POA: Diagnosis not present

## 2018-07-08 DIAGNOSIS — K621 Rectal polyp: Secondary | ICD-10-CM

## 2018-07-08 DIAGNOSIS — D509 Iron deficiency anemia, unspecified: Secondary | ICD-10-CM | POA: Diagnosis not present

## 2018-07-08 DIAGNOSIS — D508 Other iron deficiency anemias: Secondary | ICD-10-CM

## 2018-07-08 DIAGNOSIS — Z862 Personal history of diseases of the blood and blood-forming organs and certain disorders involving the immune mechanism: Secondary | ICD-10-CM

## 2018-07-08 HISTORY — PX: ESOPHAGOGASTRODUODENOSCOPY (EGD) WITH PROPOFOL: SHX5813

## 2018-07-08 HISTORY — PX: COLONOSCOPY WITH PROPOFOL: SHX5780

## 2018-07-08 SURGERY — COLONOSCOPY WITH PROPOFOL
Anesthesia: General

## 2018-07-08 MED ORDER — SODIUM CHLORIDE 0.9 % IV SOLN
INTRAVENOUS | Status: DC
  Administered 2018-07-08: 1000 mL via INTRAVENOUS

## 2018-07-08 MED ORDER — PROPOFOL 500 MG/50ML IV EMUL
INTRAVENOUS | Status: DC | PRN
  Administered 2018-07-08: 120 ug/kg/min via INTRAVENOUS

## 2018-07-08 MED ORDER — LIDOCAINE HCL (CARDIAC) PF 100 MG/5ML IV SOSY
PREFILLED_SYRINGE | INTRAVENOUS | Status: DC | PRN
  Administered 2018-07-08: 50 mg via INTRATRACHEAL

## 2018-07-08 MED ORDER — PROPOFOL 10 MG/ML IV BOLUS
INTRAVENOUS | Status: DC | PRN
  Administered 2018-07-08: 100 mg via INTRAVENOUS

## 2018-07-08 NOTE — Anesthesia Postprocedure Evaluation (Signed)
Anesthesia Post Note  Patient: ELON LOMELI  Procedure(s) Performed: COLONOSCOPY WITH PROPOFOL (N/A ) ESOPHAGOGASTRODUODENOSCOPY (EGD) WITH PROPOFOL (N/A )  Patient location during evaluation: Endoscopy Anesthesia Type: General Level of consciousness: awake and alert Pain management: pain level controlled Vital Signs Assessment: post-procedure vital signs reviewed and stable Respiratory status: spontaneous breathing, nonlabored ventilation, respiratory function stable and patient connected to nasal cannula oxygen Cardiovascular status: blood pressure returned to baseline and stable Postop Assessment: no apparent nausea or vomiting Anesthetic complications: no     Last Vitals:  Vitals:   07/08/18 0728 07/08/18 0948  BP: 115/83 101/64  Pulse: 96 88  Resp: 16 17  Temp: (!) 35.8 C (!) 36.2 C  SpO2: 100% 100%    Last Pain:  Vitals:   07/08/18 1018  TempSrc:   PainSc: 0-No pain                 Evey Mcmahan S

## 2018-07-08 NOTE — Anesthesia Post-op Follow-up Note (Signed)
Anesthesia QCDR form completed.        

## 2018-07-08 NOTE — Op Note (Addendum)
[facility] Gastroenterology Patient Name: [name] [name] Procedure Date: 07/08/2018 8:59 AM MRN: [id number] Account #: [id number] Date of Birth: 1970-08-25 Admit Type: Outpatient Age: 48 Room: [facility] ENDO ROOM 2 Gender: Female Note Status: Finalized Procedure:            Colonoscopy Indications:          Screening for colorectal malignant neoplasm, Incidental                        - Iron deficiency anemia Providers:            Leor Whyte B. Bonna Gains MD, MD Referring MD:         Bethena Roys. Sowles, MD (Referring MD) Medicines:            Monitored Anesthesia Care Complications:        No immediate complications. Procedure:            Pre-Anesthesia Assessment:                       - ASA Grade Assessment: II - A patient with mild                        systemic disease.                       - Prior to the procedure, a History and Physical was                        performed, and patient medications, allergies and                        sensitivities were reviewed. The patient's tolerance of                        previous anesthesia was reviewed.                       - The risks and benefits of the procedure and the                        sedation options and risks were discussed with the                        patient. All questions were answered and informed                        consent was obtained.                       - Patient identification and proposed procedure were                        verified prior to the procedure by the physician, the                        nurse, the anesthesiologist, the anesthetist and the                        technician. The procedure was verified in the procedure  room.                       After obtaining informed consent, the colonoscope was                        passed under direct vision. Throughout the procedure,                        the patient's blood pressure, pulse, and oxygen                 saturations were monitored continuously. The                        Colonoscope was introduced through the anus and                        advanced to the the cecum, identified by appendiceal                        orifice and ileocecal valve. The colonoscopy was                        performed with ease. The patient tolerated the                        procedure well. The quality of the bowel preparation                        was good. Findings:      The perianal and digital rectal examinations were normal.      A 6 mm polyp was found in the rectum. The polyp was sessile. The polyp       was removed with a jumbo cold forceps. Resection and retrieval were       complete. The polyp was 1 cm proximal to the anal verge and thus was       safe to remove with biopsy forceps.      Multiple diverticula were found in the sigmoid colon.      The exam was otherwise without abnormality.      The rectum, sigmoid colon, descending colon, transverse colon, ascending       colon and cecum appeared normal.      No additional abnormalities were found on retroflexion. Impression:           - One 6 mm polyp in the rectum, removed with a jumbo                        cold forceps. Resected and retrieved.                       - Diverticulosis in the sigmoid colon.                       - The examination was otherwise normal.                       - The rectum, sigmoid colon, descending colon,                        transverse colon, ascending colon and cecum are normal. Recommendation:       -  To visualize the small bowel, perform video capsule                        endoscopy at appointment to be scheduled.                       - Discharge patient to home (with escort).                       - Advance diet as tolerated.                       - Continue present medications.                       - Await pathology results.                       - Repeat colonoscopy date to be determined  after                        pending pathology results are reviewed.                       - The findings and recommendations were discussed with                        the patient.                       - The findings and recommendations were discussed with                        the patient's family.                       - Return to primary care physician as previously                        scheduled.                       - High fiber diet. Procedure Code(s):    --- Professional ---                       [phone], Colonoscopy, flexible; with biopsy, single or                        multiple Diagnosis Code(s):    --- Professional ---                       Z12.11, Encounter for screening for malignant neoplasm                        of colon                       K62.1, Rectal polyp                       K57.30, Diverticulosis of large intestine without                        perforation or abscess without bleeding CPT  copyright 2018 American Medical Association. All rights reserved. The codes documented in this report are preliminary and upon coder review may  be revised to meet current compliance requirements.  Vonda Antigua, MD Margretta Sidle B. Bonna Gains MD, MD 07/08/2018 9:51:15 AM This report has been signed electronically. Number of Addenda: 0 Note Initiated On: 07/08/2018 8:59 AM Scope Withdrawal Time: 0 hours 22 minutes 0 seconds  Total Procedure Duration: 0 hours 28 minutes 24 seconds  Estimated Blood Loss: Estimated blood loss: none.      [facility]

## 2018-07-08 NOTE — H&P (Addendum)
[name] Antigua, MD [address], [place], [place], [place], [address] [address], [place], [place], [place], [address] Phone: [phone]  Fax: [phone]  Primary Care Physician:  [name] Sizer, MD   Pre-Procedure History & Physical: HPI:  [name] [name] is a 48 y.o. female is here for a colonoscopy and EGD.   Past Medical History:  Diagnosis Date  . Essential hypertension, benign     Past Surgical History:  Procedure Laterality Date  . CESAREAN SECTION    . TEAR DUCT PROBING      Prior to Admission medications   Medication Sig Start Date End Date Taking? Authorizing Provider  docusate sodium (COLACE) 100 MG capsule Take 1 capsule (100 mg total) by mouth 2 (two) times daily. With iron pills for constipation 04/19/18  Yes Sowles, Drue Stager, MD  ferrous sulfate 325 (65 FE) MG tablet Take 1 tablet (325 mg total) by mouth 2 (two) times daily with a meal. 04/19/18  Yes Sowles, Drue Stager, MD  lisinopril-hydrochlorothiazide (PRINZIDE,ZESTORETIC) 10-12.5 MG tablet Take 1 tablet by mouth daily. 04/15/18  Yes Sowles, Drue Stager, MD  Omega-3 Fatty Acids (FISH OIL) 645 MG CAPS Take by mouth.   Yes [provider]  Vitamin D, Ergocalciferol, (DRISDOL) 1.25 MG (50000 UT) CAPS capsule Take 1 capsule (50,000 Units total) by mouth every 7 (seven) days. 04/19/18  Yes [name] Sizer, MD    Allergies as of 06/14/2018 - Review Complete 06/13/2018  Allergen Reaction Noted  . Zantac [ranitidine hcl]  09/26/2014    Family History  Problem Relation Age of Onset  . Hypertension Mother   . Hyperlipidemia Mother   . Hypertension Father     Social History   Socioeconomic History  . Marital status: Married    Spouse name: [name] [name]   . Number of children: 2  . Years of education: Not on file  . Highest education level: Master's degree (e.g., MA, MS, MEng, MEd, MSW, MBA)  Occupational History  . Occupation: Patent attorney    Comment: therapist and supervisor     Social Needs  . Financial resource strain: Not hard at all  . Food insecurity:    Worry: Never true    Inability: Never true  . Transportation needs:    Medical: No    Non-medical: No  Tobacco Use  . Smoking status: Never Smoker  . Smokeless tobacco: Never Used  Substance and Sexual Activity  . Alcohol use: Yes    Alcohol/week: 1.0 standard drinks    Types: 1 Glasses of wine per week    Comment: seldom  every 4-5 months  . Drug use: No  . Sexual activity: Yes    Partners: Male    Birth control/protection: Surgical  Lifestyle  . Physical activity:    Days per week: 0 days    Minutes per session: 0 min  . Stress: Not at all  Relationships  . Social connections:    Talks on phone: More than three times a week    Gets together: Twice a week    Attends religious service: More than 4 times per year    Active member of club or organization: Yes    Attends meetings of clubs or organizations: More than 4 times per year    Relationship status: Married  . Intimate partner violence:    Fear of current or ex partner: No    Emotionally abused: No    Physically abused: No    Forced sexual activity: No  Other Topics  Concern  . Not on file  Social History Narrative   Two boys, 14 years apart     Review of Systems: See HPI, otherwise negative ROS  Physical Exam: BP 115/83   Pulse 96   Temp (!) 96.5 F (35.8 C) (Tympanic)   Resp 16   Ht 5' (1.524 m)   Wt 64.4 kg   SpO2 100%   BMI 27.73 kg/m  General:   Alert,  pleasant and cooperative in NAD Head:  Normocephalic and atraumatic. Neck:  Supple; no masses or thyromegaly. Lungs:  Clear throughout to auscultation, normal respiratory effort.    Heart:  +S1, +S2, Regular rate and rhythm, No edema. Abdomen:  Soft, nontender and nondistended. Normal bowel sounds, without guarding, and without rebound.   Neurologic:  Alert and  oriented x4;  grossly normal neurologically.  Impression/Plan: [name] is here for a  colonoscopy for screening and EGD for iron deficiency anemia  Risks, benefits, limitations, and alternatives regarding the procedures have been reviewed with the patient.  Questions have been answered.  All parties agreeable.   [name] Manifold, MD  07/08/2018, 8:23 AM

## 2018-07-08 NOTE — Transfer of Care (Signed)
Immediate Anesthesia Transfer of Care Note  Patient: [name] [name]  Procedure(s) Performed: COLONOSCOPY WITH PROPOFOL (N/A ) ESOPHAGOGASTRODUODENOSCOPY (EGD) WITH PROPOFOL (N/A )  Patient Location: PACU and Endoscopy Unit  Anesthesia Type:General  Level of Consciousness: awake and alert   Airway & Oxygen Therapy: Patient connected to nasal cannula oxygen  Post-op Assessment: Report given to RN and Post -op Vital signs reviewed and stable  Post vital signs: Reviewed and stable  Last Vitals:  Vitals Value Taken Time  BP 101/64 07/08/2018  9:48 AM  Temp 36.2 C 07/08/2018  9:48 AM  Pulse 79 07/08/2018  9:53 AM  Resp 15 07/08/2018  9:53 AM  SpO2 100 % 07/08/2018  9:53 AM  Vitals shown include unvalidated device data.  Last Pain:  Vitals:   07/08/18 0948  TempSrc: Tympanic  PainSc: 0-No pain         Complications: No apparent anesthesia complications

## 2018-07-08 NOTE — Op Note (Addendum)
[facility] Gastroenterology Patient Name: [name] [name] Procedure Date: 07/08/2018 9:00 AM MRN: [id number] Account #: [id number] Date of Birth: February 03, 1971 Admit Type: Outpatient Age: 48 Room: [facility] ENDO ROOM 2 Gender: Female Note Status: Finalized Procedure:            Upper GI endoscopy Indications:          Iron deficiency anemia Providers:            Kaneisha Ellenberger B. Bonna Gains MD, MD Referring MD:         Bethena Roys. Sowles, MD (Referring MD) Medicines:            Monitored Anesthesia Care Complications:        No immediate complications. Procedure:            Pre-Anesthesia Assessment:                       - Prior to the procedure, a History and Physical was                        performed, and patient medications, allergies and                        sensitivities were reviewed. The patient's tolerance of                        previous anesthesia was reviewed.                       - The risks and benefits of the procedure and the                        sedation options and risks were discussed with the                        patient. All questions were answered and informed                        consent was obtained.                       - Patient identification and proposed procedure were                        verified prior to the procedure by the physician, the                        nurse, the anesthesiologist, the anesthetist and the                        technician. The procedure was verified in the procedure                        room.                       - ASA Grade Assessment: II - A patient with mild                        systemic disease.  After obtaining informed consent, the endoscope was                        passed under direct vision. Throughout the procedure,                        the patient's blood pressure, pulse, and oxygen                        saturations were monitored continuously. The Endoscope                   was introduced through the mouth, and advanced to the                        second part of duodenum. The upper GI endoscopy was                        accomplished with ease. The patient tolerated the                        procedure well. The Endoscope was introduced through                        the mouth, and advanced to the. Findings:      The examined esophagus was normal.      The entire examined stomach was normal.      A small hiatal hernia was present.      The duodenal bulb, second portion of the duodenum and examined duodenum       were normal. Biopsies for histology were taken with a cold forceps for       evaluation of celiac disease. Impression:           - Normal esophagus.                       - Normal stomach.                       - Small hiatal hernia.                       - Normal duodenal bulb, second portion of the duodenum                        and examined duodenum. Biopsied. Recommendation:       - Await pathology results.                       - To visualize the small bowel, perform video capsule                        endoscopy at appointment to be scheduled.                       - Discharge patient to home (with escort).                       - Advance diet as tolerated.                       - Continue present medications.                       -  Patient has a contact number available for                        emergencies. The signs and symptoms of potential                        delayed complications were discussed with the patient.                        Return to normal activities tomorrow. Written discharge                        instructions were provided to the patient.                       - Discharge patient to home (with escort).                       - The findings and recommendations were discussed with                        the patient.                       - The findings and recommendations were discussed with                         the patient's family. Procedure Code(s):    --- Professional ---                       [phone], Esophagogastroduodenoscopy, flexible, transoral;                        with biopsy, single or multiple Diagnosis Code(s):    --- Professional ---                       K44.9, Diaphragmatic hernia without obstruction or                        gangrene                       D50.9, Iron deficiency anemia, unspecified CPT copyright 2018 American Medical Association. All rights reserved. The codes documented in this report are preliminary and upon coder review may  be revised to meet current compliance requirements.  Vonda Antigua, MD Margretta Sidle B. Bonna Gains MD, MD 07/08/2018 9:47:04 AM This report has been signed electronically. Number of Addenda: 0 Note Initiated On: 07/08/2018 9:00 AM Estimated Blood Loss: Estimated blood loss: none.      [facility]

## 2018-07-08 NOTE — Anesthesia Preprocedure Evaluation (Signed)
Anesthesia Evaluation  Patient identified by MRN, date of birth, ID band Patient awake    Reviewed: Allergy & Precautions, NPO status , Patient's Chart, lab work & pertinent test results, reviewed documented beta blocker date and time   Airway Mallampati: II  TM Distance: >3 FB     Dental  (+) Chipped   Pulmonary           Cardiovascular hypertension, Pt. on medications      Neuro/Psych    GI/Hepatic GERD  Medicated,  Endo/Other    Renal/GU      Musculoskeletal   Abdominal   Peds  Hematology   Anesthesia Other Findings Hb 9. Obese.  Reproductive/Obstetrics                             Anesthesia Physical Anesthesia Plan  ASA: III  Anesthesia Plan: General   Post-op Pain Management:    Induction: Intravenous  PONV Risk Score and Plan:   Airway Management Planned:   Additional Equipment:   Intra-op Plan:   Post-operative Plan:   Informed Consent: I have reviewed the patients History and Physical, chart, labs and discussed the procedure including the risks, benefits and alternatives for the proposed anesthesia with the patient or authorized representative who has indicated his/her understanding and acceptance.       Plan Discussed with: CRNA  Anesthesia Plan Comments:         Anesthesia Quick Evaluation

## 2018-07-11 ENCOUNTER — Encounter: Payer: Self-pay | Admitting: Gastroenterology

## 2018-07-11 LAB — SURGICAL PATHOLOGY

## 2018-07-18 ENCOUNTER — Telehealth: Payer: Self-pay

## 2018-07-18 ENCOUNTER — Other Ambulatory Visit: Payer: Self-pay

## 2018-07-18 DIAGNOSIS — D509 Iron deficiency anemia, unspecified: Secondary | ICD-10-CM

## 2018-07-18 DIAGNOSIS — R7303 Prediabetes: Secondary | ICD-10-CM

## 2018-07-18 DIAGNOSIS — Z79899 Other long term (current) drug therapy: Secondary | ICD-10-CM

## 2018-07-18 DIAGNOSIS — E7800 Pure hypercholesterolemia, unspecified: Secondary | ICD-10-CM

## 2018-07-18 DIAGNOSIS — E785 Hyperlipidemia, unspecified: Secondary | ICD-10-CM

## 2018-07-18 NOTE — Telephone Encounter (Signed)
She has follow up with me in March, we can check all her labs during visit

## 2018-07-18 NOTE — Telephone Encounter (Signed)
Looks like GI that ordered

## 2018-07-18 NOTE — Telephone Encounter (Signed)
Patient notified and labs are upfront.

## 2018-07-18 NOTE — Telephone Encounter (Signed)
From notes her Iron was low on 04/15/2018 and she rather take Iron tablets than infusion. She did go to Dr. Bonna Gains for a Colonoscopy on 07/08/2018.

## 2018-07-18 NOTE — Telephone Encounter (Signed)
I ordered all labs for her to do a few days prior to next visit

## 2018-07-18 NOTE — Telephone Encounter (Signed)
Copied from [place] [id number]. Topic: Appointment Scheduling - Scheduling Inquiry for Clinic >> Jul 18, 2018  9:50 AM Nils Flack wrote: Reason for CRM: pt said iron is low and provider want her to come back in and have rechecked. No orders in.  Please call [phone]

## 2018-07-20 ENCOUNTER — Other Ambulatory Visit: Payer: Self-pay

## 2018-07-27 ENCOUNTER — Telehealth: Payer: Self-pay

## 2018-07-27 NOTE — Telephone Encounter (Signed)
Pt notified regarding colonoscopy results and needing to schedule small bowel caps study. Pt wants to have her iron rechecked by her PCP (has appt soon) and will call office back with results. Also informed to f/u with her GYN for her irregular periods.

## 2018-07-27 NOTE — Telephone Encounter (Signed)
-----   Message from Virgel Manifold, MD sent at 07/26/2018  4:41 PM EST ----- Jackelyn Poling please let patient know, her colon polyp was benign but precancerous and has been removed.  Her next colonoscopy should be in 5 years. Her duodenal biopsies did not show celiac disease. Due to her iron deficiency anemia, I recommend a small bowel capsule study.  Please schedule. She should also follow-up with her GYN in regarding to her irregular periods.

## 2018-08-10 ENCOUNTER — Encounter: Payer: Self-pay | Admitting: Family Medicine

## 2018-08-10 ENCOUNTER — Other Ambulatory Visit (HOSPITAL_COMMUNITY)
Admission: RE | Admit: 2018-08-10 | Discharge: 2018-08-10 | Disposition: A | Discharge status: Home / Self Care | Payer: BLUE CROSS/BLUE SHIELD | Source: Ambulatory Visit | Attending: Family Medicine | Admitting: Family Medicine

## 2018-08-10 ENCOUNTER — Ambulatory Visit (INDEPENDENT_AMBULATORY_CARE_PROVIDER_SITE_OTHER): Payer: Self-pay | Admitting: Family Medicine

## 2018-08-10 VITALS — BP 132/76 | HR 99 | Temp 97.9°F | Resp 16 | Ht 60.0 in | Wt 147.0 lb

## 2018-08-10 DIAGNOSIS — Z124 Encounter for screening for malignant neoplasm of cervix: Secondary | ICD-10-CM

## 2018-08-10 DIAGNOSIS — Z01419 Encounter for gynecological examination (general) (routine) without abnormal findings: Secondary | ICD-10-CM

## 2018-08-10 DIAGNOSIS — E785 Hyperlipidemia, unspecified: Secondary | ICD-10-CM | POA: Diagnosis not present

## 2018-08-10 DIAGNOSIS — R7303 Prediabetes: Secondary | ICD-10-CM | POA: Diagnosis not present

## 2018-08-10 DIAGNOSIS — N944 Primary dysmenorrhea: Secondary | ICD-10-CM

## 2018-08-10 DIAGNOSIS — D509 Iron deficiency anemia, unspecified: Secondary | ICD-10-CM | POA: Diagnosis not present

## 2018-08-10 DIAGNOSIS — Z79899 Other long term (current) drug therapy: Secondary | ICD-10-CM | POA: Diagnosis not present

## 2018-08-10 DIAGNOSIS — Z1159 Encounter for screening for other viral diseases: Secondary | ICD-10-CM

## 2018-08-10 MED ORDER — NAPROXEN 500 MG PO TABS: 500.0000 mg | ORAL_TABLET | Freq: Two times a day (BID) | ORAL | 0 refills | Status: DC

## 2018-08-10 NOTE — Progress Notes (Signed)
Name: [name] [name]   MRN: [id number]    DOB: May 28, 1971   Date:08/10/2018       Progress Note  Subjective  Chief Complaint  Chief Complaint  Patient presents with  . Gynecologic Exam    pap only    HPI   Patient presents for annual CPE   Diet: she plans her meals on Sundays, cooking with olive oil, eating salads and vegetables, also eating fish and chicken Exercise: she just joined a gym, she will start exercising  USPSTF grade A and B recommendations    Office Visit from 04/15/2018 in [facility]  AUDIT-C Score  1     Depression:  Depression screen [facility] 2/9 04/15/2018 05/31/2017 12/23/2016 11/03/2016 11/07/2015  Decreased Interest 0 0 0 0 0  Down, Depressed, Hopeless 0 0 0 0 0  PHQ - 2 Score 0 0 0 0 0  Altered sleeping 0 - - - -  Tired, decreased energy 0 - - - -  Change in appetite 0 - - - -  Feeling bad or failure about yourself  0 - - - -  Trouble concentrating 0 - - - -  Moving slowly or fidgety/restless 0 - - - -  Suicidal thoughts 0 - - - -  PHQ-9 Score 0 - - - -  Difficult doing work/chores Not difficult at all - - - -   Hypertension: BP Readings from Last 3 Encounters:  07/08/18 101/64  06/13/18 100/68  04/15/18 112/68   Obesity: Wt Readings from Last 3 Encounters:  08/10/18 147 lb (66.7 kg)  07/08/18 142 lb (64.4 kg)  06/13/18 147 lb 12.8 oz (67 kg)   BMI Readings from Last 3 Encounters:  08/10/18 28.71 kg/m  07/08/18 27.73 kg/m  06/13/18 27.93 kg/m    Hep C Screening: N/A STD testing and prevention (HIV/chl/gon/syphilis): N/A Intimate partner violence: negative screen  Sexual History/Pain during Intercourse: no pain, libido waxes and wanes  Menstrual History/LMP/Abnormal Bleeding:regular cycles but still painful , she would like refill of naproxen to take first couple of days of cycles , cycles are heavy for 2-3 days, lasts 7 days  Incontinence Symptoms: none   Advanced Care Planning: A voluntary discussion about advance  care planning including the explanation and discussion of advance directives.  Discussed health care proxy and Living will, and the patient was able to identify a health care proxy as husband.   Patient does not have a living will at present time.  Breast cancer: she needs to schedule mammogram BRCA gene screening: one grandmother had colon cancer at age 49  Cervical cancer screening: today   Osteoporosis prevention: discussed high calcium diet and sun exposure   Lipids:  Lab Results  Component Value Date   CHOL 205 (H) 04/15/2018   CHOL 210 (H) 09/02/2017   CHOL 214 (H) 05/31/2017   Lab Results  Component Value Date   HDL 50 (L) 04/15/2018   HDL 51 09/02/2017   HDL 49 (L) 05/31/2017   Lab Results  Component Value Date   LDLCALC 135 (H) 04/15/2018   LDLCALC 140 (H) 09/02/2017   LDLCALC 144 (H) 05/31/2017   Lab Results  Component Value Date   TRIG 96 04/15/2018   TRIG 86 09/02/2017   TRIG 101 05/31/2017   Lab Results  Component Value Date   CHOLHDL 4.1 04/15/2018   CHOLHDL 4.1 09/02/2017   CHOLHDL 4.4 05/31/2017   No results found for: LDLDIRECT  Glucose:  Glucose, Bld  Date  Value Ref Range Status  04/15/2018 82 65 - 99 mg/dL Final    Comment:    .            Fasting reference interval .   09/02/2017 79 65 - 99 mg/dL Final    Comment:    .            Fasting reference interval .   05/08/2016 85 65 - 99 mg/dL Final    Skin cancer: discussed atypical lesions  Colorectal cancer: up to date    Lung cancer:   Low Dose CT Chest recommended if Age 55-80 years, 30 pack-year currently smoking OR have quit w/in 15years. Patient does not qualify.   ECG:10/2017  Patient Active Problem List   Diagnosis Date Noted  . Special screening for malignant neoplasms, colon   . Rectal polyp   . Diverticulosis of large intestine without diverticulitis   . Hiatal hernia   . Iron deficiency anemia   . Pre-diabetes 10/13/2017  . Intermittent low back pain 10/13/2017  .  History of gestational diabetes 10/13/2017  . Hyperlipidemia 05/08/2016  . Essential hypertension 12/20/2014  . Obesity 12/20/2014  . Gastro-esophageal reflux disease without esophagitis 01/25/2010    Past Surgical History:  Procedure Laterality Date  . CESAREAN SECTION    . COLONOSCOPY WITH PROPOFOL N/A 07/08/2018   Procedure: COLONOSCOPY WITH PROPOFOL;  Surgeon: Tahiliani, Varnita B, MD;  Location: [facility] ENDOSCOPY;  Service: Endoscopy;  Laterality: N/A;  . ESOPHAGOGASTRODUODENOSCOPY (EGD) WITH PROPOFOL N/A 07/08/2018   Procedure: ESOPHAGOGASTRODUODENOSCOPY (EGD) WITH PROPOFOL;  Surgeon: Tahiliani, Varnita B, MD;  Location: [facility] ENDOSCOPY;  Service: Endoscopy;  Laterality: N/A;  . TEAR DUCT PROBING      Family History  Problem Relation Age of Onset  . Hypertension Mother   . Hyperlipidemia Mother   . Hypertension Father     Social History   Socioeconomic History  . Marital status: Married    Spouse name: Ricky   . Number of children: 2  . Years of education: Not on file  . Highest education level: Master's degree (e.g., MA, MS, MEng, MEd, MSW, MBA)  Occupational History  . 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 Financial resource strain: Not hard at all  . Food insecurity:    Worry: Never true    Inability: Never true  . Transportation needs:    Medical: No    Non-medical: No  Tobacco Use  . Smoking status: Never Smoker  . Smokeless tobacco: Never Used  Substance and Sexual Activity  . Alcohol use: Yes    Alcohol/week: 1.0 standard drinks    Types: 1 Glasses of wine per week    Comment: seldom  every 4-5 months  . Drug use: No  . Sexual activity: Yes    Partners: Male    Birth control/protection: Surgical  Lifestyle  . Physical activity:    Days per week: 0 days    Minutes per session: 0 min  . Stress: Not at all  Relationships  . Social connections:    Talks on phone: More than three times a week     Gets together: Twice a week    Attends religious service: More than 4 times per year    Active member of club or organization: Yes    Attends meetings of clubs or organizations: More than 4 times per year    Relationship status: Married  . Intimate partner violence:      Fear of current or ex partner: No    Emotionally abused: No    Physically abused: No    Forced sexual activity: No  Other Topics Concern  . Not on file  Social History Narrative   Two boys, 14 years apart      Current Outpatient Medications:  .  docusate sodium (COLACE) 100 MG capsule, Take 1 capsule (100 mg total) by mouth 2 (two) times daily. With iron pills for constipation, Disp: 180 capsule, Rfl: 1 .  ferrous sulfate 325 (65 FE) MG tablet, Take 1 tablet (325 mg total) by mouth 2 (two) times daily with a meal., Disp: 180 tablet, Rfl: 1 .  lisinopril-hydrochlorothiazide (PRINZIDE,ZESTORETIC) 10-12.5 MG tablet, Take 1 tablet by mouth daily., Disp: 90 tablet, Rfl: 3 .  Omega-3 Fatty Acids (FISH OIL) 645 MG CAPS, Take by mouth., Disp: , Rfl:  .  Vitamin D, Ergocalciferol, (DRISDOL) 1.25 MG (50000 UT) CAPS capsule, Take 1 capsule (50,000 Units total) by mouth every 7 (seven) days., Disp: 12 capsule, Rfl: 1  Allergies  Allergen Reactions  . Zantac [Ranitidine Hcl]      ROS  Constitutional: Negative for fever or weight change.  Respiratory: Negative for cough and shortness of breath.   Cardiovascular: Negative for chest pain or palpitations.  Gastrointestinal: Negative for abdominal pain, no bowel changes.  Musculoskeletal: Negative for gait problem or joint swelling.  Skin: Negative for rash.  Neurological: Negative for dizziness or headache.  No other specific complaints in a complete review of systems (except as listed in HPI above).  Objective  Vitals:   08/10/18 1125  Pulse: 99  Resp: 16  Temp: 97.9 F (36.6 C)  TempSrc: Oral  SpO2: 99%  Weight: 147 lb (66.7 kg)  Height: 5' (1.524 m)    Body  mass index is 28.71 kg/m.  Physical Exam  Constitutional: Patient appears well-developed and well-nourished. No distress.  HENT: Head: Normocephalic and atraumatic. Ears: B TMs ok, no erythema or effusion; Nose: Nose normal. Mouth/Throat: Oropharynx is clear and moist. No oropharyngeal exudate.  Eyes: Conjunctivae and EOM are normal. Pupils are equal, round, and reactive to light. No scleral icterus.  Neck: Normal range of motion. Neck supple. No JVD present. No thyromegaly present.  Cardiovascular: Normal rate, regular rhythm and normal heart sounds.  No murmur heard. No BLE edema. Pulmonary/Chest: Effort normal and breath sounds normal. No respiratory distress. Abdominal: Soft. Bowel sounds are normal, no distension. There is no tenderness. no masses Breast: no lumps or masses, no nipple discharge or rashes FEMALE GENITALIA:  External genitalia normal External urethra normal Vaginal vault normal without discharge or lesions Cervix normal without discharge or lesions Bimanual exam normal without masses RECTAL: no rectal masses or hemorrhoids Musculoskeletal: Normal range of motion, no joint effusions. No gross deformities Neurological: he is alert and oriented to person, place, and time. No cranial nerve deficit. Coordination, balance, strength, speech and gait are normal.  Skin: Skin is warm and dry. No rash noted. No erythema.  Psychiatric: Patient has a normal mood and affect. behavior is normal. Judgment and thought content normal.   Recent Results (from the past 2160 hour(s))  Surgical pathology     Status: None   Collection Time: 07/08/18  8:32 AM  Result Value Ref Range   SURGICAL PATHOLOGY      Surgical Pathology CASE: [id number] PATIENT: Tata Jimmye Norman Surgical Pathology Report     SPECIMEN SUBMITTED: A. Duodenum, upper to r/o celiac; cbx B. Rectum polyp x1, cbx  CLINICAL HISTORY: None provided  PRE-OPERATIVE DIAGNOSIS: D50.9 IDA  POST-OPERATIVE  DIAGNOSIS: Normal upper     DIAGNOSIS: A.  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 - NEGATIVE FOR INTRAEPITHELIAL LYMPHOCYTOSIS AND INFECTIOUS AGENTS.  B.  RECTUM POLYP X 1; COLD BIOPSY: - TUBULAR ADENOMA, MULTIPLE FRAGMENTS. - NEGATIVE FOR HIGH-GRADE DYSPLASIA AND MALIGNANCY.  GROSS DESCRIPTION: A. Labeled: C BX duodenal (upper) to rule out celiac Received: Formalin Tissue fragment(s): Multiple Size: Aggregate, 0.8 x 0.3 x 0.1 cm Description: Tan soft tissue fragments Entirely submitted in 1 cassette.  B. Labeled: C BX rectal colon polyp x1 Received: Formalin Tissue fragment(s): Multiple Size: Aggregate , 1.5 x 0.3 x 0.1 cm Description: Tan soft tissue fragments Entirely submitted in 1 cassette.   Final Diagnosis performed by Mary Olney, MD.   Electronically signed 07/11/2018 6:24:12PM The electronic signature indicates that the named Attending Pathologist has evaluated the specimen  Technical component performed at [facility], [address], [place], [place] [address] Lab: [phone] Dir: Sanjai Nagendra, MD, MMM  Professional component performed at [facility], [facility], [address], [place], [place] [address] Lab: [phone] Dir: Tara C. Rubinas, MD       PHQ2/9: Depression screen PHQ 2/9 04/15/2018 05/31/2017 12/23/2016 11/03/2016 11/07/2015  Decreased Interest 0 0 0 0 0  Down, Depressed, Hopeless 0 0 0 0 0  PHQ - 2 Score 0 0 0 0 0  Altered sleeping 0 - - - -  Tired, decreased energy 0 - - - -  Change in appetite 0 - - - -  Feeling bad or failure about yourself  0 - - - -  Trouble concentrating 0 - - - -  Moving slowly or fidgety/restless 0 - - - -  Suicidal thoughts 0 - - - -  PHQ-9 Score 0 - - - -  Difficult doing work/chores Not difficult at all - - - -     Fall Risk: Fall Risk  08/10/2018 04/15/2018 10/13/2017 05/31/2017 12/23/2016  Falls in the past  year? 0 0 No No No  Number falls in past yr: 0 - - - -  Injury with Fall? 0 - - - -     Functional Status Survey: Is the patient deaf or have difficulty hearing?: No Does the patient have difficulty seeing, even when wearing glasses/contacts?: No Does the patient have difficulty concentrating, remembering, or making decisions?: No Does the patient have difficulty walking or climbing stairs?: No Does the patient have difficulty dressing or bathing?: No Does the patient have difficulty doing errands alone such as visiting a doctor's office or shopping?: No   Assessment & Plan  1. Well woman exam   2. Cervical cancer screening  - Cytology - PAP  3. Primary dysmenorrhea  - naproxen (NAPROSYN) 500 MG tablet; Take 1 tablet (500 mg total) by mouth 2 (two) times daily with a meal.  Dispense: 30 tablet; Refill: 0  4. Need for hepatitis C screening test  - Hepatitis C Antibody   -USPSTF grade A and B recommendations reviewed with patient; age-appropriate recommendations, preventive care, screening tests, etc discussed and encouraged; healthy living encouraged; see AVS for patient education given to patient -Discussed importance of 150 minutes of physical activity weekly, eat two servings of fish weekly, eat one serving of tree nuts ( cashews, pistachios, pecans, almonds..) every other day, eat 6 servings of fruit/vegetables daily and drink plenty of water and avoid sweet beverages.    

## 2018-08-11 ENCOUNTER — Other Ambulatory Visit: Payer: Self-pay | Admitting: Family Medicine

## 2018-08-11 DIAGNOSIS — D509 Iron deficiency anemia, unspecified: Secondary | ICD-10-CM

## 2018-08-11 LAB — CBC WITH DIFFERENTIAL/PLATELET
Absolute Monocytes: 506 cells/uL (ref 200–950)
Basophils Absolute: 58 cells/uL (ref 0–200)
Basophils Relative: 0.9 %
Eosinophils Absolute: 141 cells/uL (ref 15–500)
Eosinophils Relative: 2.2 %
HCT: 31 % — ABNORMAL LOW (ref 35.0–45.0)
Hemoglobin: 9.6 g/dL — ABNORMAL LOW (ref 11.7–15.5)
Lymphs Abs: 1325 cells/uL (ref 850–3900)
MCH: 23 pg — ABNORMAL LOW (ref 27.0–33.0)
MCHC: 31 g/dL — ABNORMAL LOW (ref 32.0–36.0)
MCV: 74.2 fL — ABNORMAL LOW (ref 80.0–100.0)
MPV: 10.5 fL (ref 7.5–12.5)
Monocytes Relative: 7.9 %
Neutro Abs: 4371 cells/uL (ref 1500–7800)
Neutrophils Relative %: 68.3 %
Platelets: 567 10*3/uL — ABNORMAL HIGH (ref 140–400)
RBC: 4.18 10*6/uL (ref 3.80–5.10)
RDW: 19.5 % — ABNORMAL HIGH (ref 11.0–15.0)
Total Lymphocyte: 20.7 %
WBC: 6.4 10*3/uL (ref 3.8–10.8)

## 2018-08-11 LAB — IRON,TIBC AND FERRITIN PANEL
%SAT: 4 % (calc) — ABNORMAL LOW (ref 16–45)
Ferritin: 5 ng/mL — ABNORMAL LOW (ref 16–232)
Iron: 23 ug/dL — ABNORMAL LOW (ref 40–190)
TIBC: 515 mcg/dL (calc) — ABNORMAL HIGH (ref 250–450)

## 2018-08-11 LAB — COMPLETE METABOLIC PANEL WITH GFR
AG Ratio: 1.8 (calc) (ref 1.0–2.5)
ALT: 15 U/L (ref 6–29)
AST: 18 U/L (ref 10–35)
Albumin: 4.8 g/dL (ref 3.6–5.1)
Alkaline phosphatase (APISO): 57 U/L (ref 31–125)
BUN: 11 mg/dL (ref 7–25)
CO2: 28 mmol/L (ref 20–32)
Calcium: 9.7 mg/dL (ref 8.6–10.2)
Chloride: 103 mmol/L (ref 98–110)
Creat: 0.86 mg/dL (ref 0.50–1.10)
GFR, Est African American: 92 mL/min/{1.73_m2} (ref >=60)
GFR, Est Non African American: 79 mL/min/{1.73_m2} (ref >=60)
Globulin: 2.7 g/dL (calc) (ref 1.9–3.7)
Glucose, Bld: 74 mg/dL (ref 65–99)
Potassium: 4 mmol/L (ref 3.5–5.3)
Sodium: 140 mmol/L (ref 135–146)
Total Bilirubin: 0.2 mg/dL (ref 0.2–1.2)
Total Protein: 7.5 g/dL (ref 6.1–8.1)

## 2018-08-11 LAB — LIPID PANEL
Cholesterol: 220 mg/dL — ABNORMAL HIGH (ref <200)
HDL: 57 mg/dL (ref >=50)
LDL Cholesterol (Calc): 137 mg/dL (calc) — ABNORMAL HIGH
Non-HDL Cholesterol (Calc): 163 mg/dL (calc) — ABNORMAL HIGH (ref <130)
Total CHOL/HDL Ratio: 3.9 (calc) (ref <5.0)
Triglycerides: 131 mg/dL (ref <150)

## 2018-08-11 LAB — HEMOGLOBIN A1C
Hgb A1c MFr Bld: 5.4 % of total Hgb (ref <5.7)
Mean Plasma Glucose: 108 (calc)
eAG (mmol/L): 6 (calc)

## 2018-08-11 LAB — HEPATITIS C ANTIBODY
Hepatitis C Ab: NONREACTIVE
SIGNAL TO CUT-OFF: 0.17 (ref <1.00)

## 2018-08-12 ENCOUNTER — Other Ambulatory Visit: Payer: Self-pay | Admitting: Family Medicine

## 2018-08-12 ENCOUNTER — Telehealth: Payer: Self-pay | Admitting: Family Medicine

## 2018-08-12 DIAGNOSIS — D508 Other iron deficiency anemias: Secondary | ICD-10-CM

## 2018-08-12 LAB — CYTOLOGY - PAP
Diagnosis: NEGATIVE
HPV: NOT DETECTED

## 2018-08-12 NOTE — Telephone Encounter (Signed)
Copied from [place] [phone]. Topic: General - Other >> Aug 12, 2018 10:38 AM Carolyn Stare wrote:  Pt calling back about lab results and would like a call back

## 2018-08-22 ENCOUNTER — Other Ambulatory Visit: Payer: Self-pay

## 2018-08-22 ENCOUNTER — Inpatient Hospital Stay: Payer: BLUE CROSS/BLUE SHIELD | Attending: Oncology | Admitting: Oncology

## 2018-08-22 ENCOUNTER — Encounter: Payer: Self-pay | Admitting: Oncology

## 2018-08-22 VITALS — BP 137/89 | HR 86 | Temp 97.5°F | Resp 18

## 2018-08-22 DIAGNOSIS — D509 Iron deficiency anemia, unspecified: Secondary | ICD-10-CM | POA: Insufficient documentation

## 2018-08-22 DIAGNOSIS — N920 Excessive and frequent menstruation with regular cycle: Secondary | ICD-10-CM | POA: Diagnosis not present

## 2018-08-22 DIAGNOSIS — R531 Weakness: Secondary | ICD-10-CM | POA: Diagnosis not present

## 2018-08-22 DIAGNOSIS — R5383 Other fatigue: Secondary | ICD-10-CM

## 2018-08-22 NOTE — Progress Notes (Signed)
[name] [name]  Telephone:([phone]) [phone] Fax:(336) [phone]  ID: [name] [name] OB: 10-31-1970  MR#: [id number]  NKN#:[id number]  Patient Care Team: [name] Sizer, MD as PCP - General (Family Medicine)  CHIEF COMPLAINT: Iron deficiency anemia  INTERVAL HISTORY: Patient is a 48 year old female who was noted to have significant iron deficiency anemia.  She was placed on oral iron supplementation for greater than 3 months with minimal improvement of her hemoglobin or iron stores.  She had colonoscopy, EGD, and capsule endoscopy in January 2020 did not reveal distinct etiology or significant pathology.  She has mild weakness and fatigue, but otherwise feels well.  She has no neurologic complaints.  She denies any recent fevers or illnesses.  She has a good appetite and denies weight loss.  She has no chest pain or shortness of breath.  She denies any nausea, vomiting, constipation, or diarrhea.  She has no melena or hematochezia.  She has no urinary complaints.  Patient otherwise feels well and offers no further specific complaints.  REVIEW OF SYSTEMS:   Review of Systems  Constitutional: Positive for malaise/fatigue. Negative for fever and weight loss.  Respiratory: Negative.  Negative for cough.   Cardiovascular: Negative.  Negative for chest pain and leg swelling.  Gastrointestinal: Negative.  Negative for abdominal pain, blood in stool and melena.  Genitourinary: Negative.  Negative for hematuria.  Musculoskeletal: Negative.  Negative for back pain.  Skin: Negative.   Neurological: Negative.  Negative for dizziness, focal weakness, weakness and headaches.  Psychiatric/Behavioral: Negative.  The patient is not nervous/anxious.     As per HPI. Otherwise, a complete review of systems is negative.  PAST MEDICAL HISTORY: Past Medical History:  Diagnosis Date  . Essential hypertension, benign     PAST SURGICAL HISTORY: Past Surgical History:  Procedure Laterality  Date  . CESAREAN SECTION    . COLONOSCOPY WITH PROPOFOL N/A 07/08/2018   Procedure: COLONOSCOPY WITH PROPOFOL;  Surgeon: Virgel Manifold, MD;  Location: [facility] ENDOSCOPY;  Service: Endoscopy;  Laterality: N/A;  . ESOPHAGOGASTRODUODENOSCOPY (EGD) WITH PROPOFOL N/A 07/08/2018   Procedure: ESOPHAGOGASTRODUODENOSCOPY (EGD) WITH PROPOFOL;  Surgeon: Virgel Manifold, MD;  Location: [facility] ENDOSCOPY;  Service: Endoscopy;  Laterality: N/A;  . TEAR DUCT PROBING      FAMILY HISTORY: Family History  Problem Relation Age of Onset  . Hypertension Mother   . Hyperlipidemia Mother   . Hypertension Father     ADVANCED DIRECTIVES (Y/N):  N  HEALTH MAINTENANCE: Social History   Tobacco Use  . Smoking status: Never Smoker  . Smokeless tobacco: Never Used  Substance Use Topics  . Alcohol use: Yes    Alcohol/week: 1.0 standard drinks    Types: 1 Glasses of wine per week    Comment: seldom  every 4-5 months  . Drug use: No     Colonoscopy:  PAP:  Bone density:  Lipid panel:  Allergies  Allergen Reactions  . Zantac [Ranitidine Hcl]     Current Outpatient Medications  Medication Sig Dispense Refill  . docusate sodium (COLACE) 100 MG capsule Take 1 capsule (100 mg total) by mouth 2 (two) times daily. With iron pills for constipation 180 capsule 1  . ferrous sulfate 325 (65 FE) MG tablet Take 1 tablet (325 mg total) by mouth 2 (two) times daily with a meal. 180 tablet 1  . lisinopril-hydrochlorothiazide (PRINZIDE,ZESTORETIC) 10-12.5 MG tablet Take 1 tablet by mouth daily. 90 tablet 3  . naproxen (NAPROSYN) 500 MG tablet Take 1 tablet (500  mg total) by mouth 2 (two) times daily with a meal. 30 tablet 0  . Omega-3 Fatty Acids (FISH OIL) 645 MG CAPS Take by mouth.    . Vitamin D, Ergocalciferol, (DRISDOL) 1.25 MG (50000 UT) CAPS capsule Take 1 capsule (50,000 Units total) by mouth every 7 (seven) days. 12 capsule 1   No current facility-administered medications for this visit.      OBJECTIVE: Vitals:   08/22/18 1126  BP: 137/89  Pulse: 86  Resp: 18  Temp: (!) 97.5 F (36.4 C)     There is no height or weight on file to calculate BMI.    ECOG FS:0 - Asymptomatic  General: Well-developed, well-nourished, no acute distress. Eyes: Pink conjunctiva, anicteric sclera. HEENT: Normocephalic, moist mucous membranes, clear oropharnyx. Lungs: Clear to auscultation bilaterally. Heart: Regular rate and rhythm. No rubs, murmurs, or gallops. Abdomen: Soft, nontender, nondistended. No organomegaly noted, normoactive bowel sounds. Musculoskeletal: No edema, cyanosis, or clubbing. Neuro: Alert, answering all questions appropriately. Cranial nerves grossly intact. Skin: No rashes or petechiae noted. Psych: Normal affect. Lymphatics: No cervical, calvicular, axillary or inguinal LAD.   LAB RESULTS:  Lab Results  Component Value Date   NA 140 08/10/2018   K 4.0 08/10/2018   CL 103 08/10/2018   CO2 28 08/10/2018   GLUCOSE 74 08/10/2018   BUN 11 08/10/2018   CREATININE 0.86 08/10/2018   CALCIUM 9.7 08/10/2018   PROT 7.5 08/10/2018   ALBUMIN 4.2 05/08/2016   AST 18 08/10/2018   ALT 15 08/10/2018   ALKPHOS 50 05/08/2016   BILITOT 0.2 08/10/2018   GFRNONAA 79 08/10/2018   GFRAA 92 08/10/2018    Lab Results  Component Value Date   WBC 6.4 08/10/2018   NEUTROABS 4,371 08/10/2018   HGB 9.6 (L) 08/10/2018   HCT 31.0 (L) 08/10/2018   MCV 74.2 (L) 08/10/2018   PLT 567 (H) 08/10/2018   Lab Results  Component Value Date   IRON 23 (L) 08/10/2018   TIBC 515 (H) 08/10/2018   IRONPCTSAT 4 (L) 08/10/2018   Lab Results  Component Value Date   FERRITIN 5 (L) 08/10/2018     STUDIES: No results found.  ASSESSMENT: Iron deficiency anemia  PLAN:    1. Iron deficiency anemia: Likely secondary to heavy menses.  Colonoscopy, EGD, and capsule endoscopy in January 2020 did not reveal any distinct pathology.  Patient was tried on a trial of oral iron which did not  significantly improve her hemoglobin or iron stores.  Patient will return to clinic later this week for 510 mg IV Feraheme.  She will then return to clinic next week for second infusion.  Patient will return to clinic in 3 months with repeat laboratory work, further evaluation, and consideration of additional IV Feraheme. 2.  Heavy menses: Continue follow-up with gynecology as indicated.  I spent a total of 45 minutes face-to-face with the patient of which greater than 50% of the visit was spent in counseling and coordination of care as detailed above.   Patient expressed understanding and was in agreement with this plan. She also understands that She can call clinic at any time with any questions, concerns, or complaints.   Cancer Staging No matching staging information was found for the patient.  Lloyd Huger, MD   08/22/2018 3:28 PM

## 2018-08-22 NOTE — Progress Notes (Signed)
Patient here today for initial visit regarding anemia.  

## 2018-08-24 ENCOUNTER — Other Ambulatory Visit: Payer: Self-pay

## 2018-08-24 ENCOUNTER — Inpatient Hospital Stay: Payer: BLUE CROSS/BLUE SHIELD

## 2018-08-24 VITALS — BP 110/72 | HR 82 | Temp 98.8°F | Resp 20

## 2018-08-24 DIAGNOSIS — D509 Iron deficiency anemia, unspecified: Secondary | ICD-10-CM

## 2018-08-24 DIAGNOSIS — N920 Excessive and frequent menstruation with regular cycle: Secondary | ICD-10-CM | POA: Diagnosis not present

## 2018-08-24 MED ORDER — SODIUM CHLORIDE 0.9 % IV SOLN
Freq: Once | INTRAVENOUS | Status: AC
  Administered 2018-08-24: 14:00:00 via INTRAVENOUS
  Filled 2018-08-24: qty 250

## 2018-08-24 MED ORDER — SODIUM CHLORIDE 0.9 % IV SOLN
510.0000 mg | Freq: Once | INTRAVENOUS | Status: AC
  Administered 2018-08-24: 510 mg via INTRAVENOUS
  Filled 2018-08-24: qty 17

## 2018-08-31 ENCOUNTER — Other Ambulatory Visit: Payer: Self-pay

## 2018-09-01 ENCOUNTER — Other Ambulatory Visit: Payer: Self-pay

## 2018-09-01 ENCOUNTER — Inpatient Hospital Stay: Payer: BLUE CROSS/BLUE SHIELD

## 2018-09-01 VITALS — BP 107/72 | HR 78 | Temp 95.9°F | Resp 20

## 2018-09-01 DIAGNOSIS — D509 Iron deficiency anemia, unspecified: Secondary | ICD-10-CM | POA: Diagnosis not present

## 2018-09-01 DIAGNOSIS — N920 Excessive and frequent menstruation with regular cycle: Secondary | ICD-10-CM | POA: Diagnosis not present

## 2018-09-01 MED ORDER — SODIUM CHLORIDE 0.9 % IV SOLN
Freq: Once | INTRAVENOUS | Status: AC
  Administered 2018-09-01: 14:00:00 via INTRAVENOUS
  Filled 2018-09-01: qty 250

## 2018-09-01 MED ORDER — SODIUM CHLORIDE 0.9 % IV SOLN
510.0000 mg | Freq: Once | INTRAVENOUS | Status: AC
  Administered 2018-09-01: 510 mg via INTRAVENOUS
  Filled 2018-09-01: qty 17

## 2018-09-14 ENCOUNTER — Ambulatory Visit: Payer: BLUE CROSS/BLUE SHIELD | Admitting: Gastroenterology

## 2018-10-14 ENCOUNTER — Encounter: Payer: Self-pay | Admitting: Family Medicine

## 2018-10-14 ENCOUNTER — Ambulatory Visit: Payer: BLUE CROSS/BLUE SHIELD | Admitting: Family Medicine

## 2018-10-14 ENCOUNTER — Other Ambulatory Visit: Payer: Self-pay

## 2018-10-14 VITALS — BP 118/78 | HR 83 | Temp 97.9°F | Resp 12 | Ht 59.0 in | Wt 148.6 lb

## 2018-10-14 DIAGNOSIS — I10 Essential (primary) hypertension: Secondary | ICD-10-CM

## 2018-10-14 DIAGNOSIS — D509 Iron deficiency anemia, unspecified: Secondary | ICD-10-CM

## 2018-10-14 DIAGNOSIS — E669 Obesity, unspecified: Secondary | ICD-10-CM

## 2018-10-14 DIAGNOSIS — E559 Vitamin D deficiency, unspecified: Secondary | ICD-10-CM

## 2018-10-14 DIAGNOSIS — E785 Hyperlipidemia, unspecified: Secondary | ICD-10-CM | POA: Diagnosis not present

## 2018-10-14 DIAGNOSIS — R7303 Prediabetes: Secondary | ICD-10-CM | POA: Diagnosis not present

## 2018-10-14 MED ORDER — VITAMIN D (ERGOCALCIFEROL) 1.25 MG (50000 UNIT) PO CAPS: 50000.0000 [IU] | ORAL_CAPSULE | ORAL | 1 refills | Status: DC

## 2018-10-14 NOTE — Progress Notes (Signed)
Name: [name] [name]   MRN: [id number]    DOB: [birth year]   Date:10/14/2018       Progress Note  Subjective  Chief Complaint  Chief Complaint  Patient presents with  . Follow-up  . Hypertension    HPI  Pre-diabetes: there is a family history of DM and she has a personal  history of gestational diabetes, denies polyphagia, polydipsia or polyuria. Last hgbA1C was 5.4% previously up to 5.7%   She has been trying to follow a diabetic diet , she has gained weight since COVID-19 secondary to lack of physical activity   Obesity: she has changed her diet, eating more fruit and vegetables, cutting down on bread, eating fish at least twice a week, however she weight recently secondary to working from home and not being as active .   HTN: she is compliant with medication, denies side effects of medication, bp is towards low end of normal, but she is not having any symptoms such as dizziness, chest pain or palpitation.   Dyslipidemia: discussed lipid panel and her risk is low, no statins at this time The 10-year ASCVD risk score Mikey Bussing DC Brooke Bonito., et al., 2013) is: 2.3%   Values used to calculate the score:     Age: 48 years     Sex: Female     Is Non-Hispanic African American: Yes     Diabetic: No     Tobacco smoker: No     Systolic Blood Pressure: 902 mmHg     Is BP treated: Yes     HDL Cholesterol: 57 mg/dL     Total Cholesterol: 220 mg/dL  Chronic low back pain: seeing chiropractor - Dr. Freddi Che weekly, but not recently, she will start going back. She states pain is worse in am, when she wakes up about 6/10, but after she gets up improves down to 2/10  Anemia found on labs done last year, she states cycles are heavy for 3 days but lasts 7 days, taking iron, seen by Dr. Grayland Ormond and had two infusions. She states her last cycle was not as heavy.   Vitamin D def: she has been out of supplementation at this time,  She wants to continue taking rx vitamin D   Patient Active Problem List    Diagnosis Date Noted  . Special screening for malignant neoplasms, colon   . Rectal polyp   . Diverticulosis of large intestine without diverticulitis   . Hiatal hernia   . Iron deficiency anemia   . Pre-diabetes 10/13/2017  . Intermittent low back pain 10/13/2017  . History of gestational diabetes 10/13/2017  . Hyperlipidemia 05/08/2016  . Essential hypertension 12/20/2014  . Obesity 12/20/2014  . Gastro-esophageal reflux disease without esophagitis 01/25/2010    Past Surgical History:  Procedure Laterality Date  . CESAREAN SECTION    . COLONOSCOPY WITH PROPOFOL N/A 07/08/2018   Procedure: COLONOSCOPY WITH PROPOFOL;  Surgeon: Virgel Manifold, MD;  Location: [facility] END[facility]COPY;  Service: Endoscopy;  Laterality: N/A;  . ESOPHAGOGASTRODUODENOSCOPY (EGD) WITH PROPOFOL N/A 07/08/2018   Procedure: ESOPHAGOGASTRODUODENOSCOPY (EGD) WITH PROPOFOL;  Surgeon: Virgel Manifold, MD;  Location: [facility] ENDOSCOPY;  Service: Endoscopy;  Laterality: N/A;  . TEAR DUCT PROBING      Family History  Problem Relation Age of Onset  . Hypertension Mother   . Hyperlipidemia Mother   . Hypertension Father     Social History   Socioeconomic History  . Marital status: Married    Spouse name: Audry Pili   .  Number of children: 2  . Years of education: Not on file  . Highest education level: Master's degree (e.g., MA, MS, MEng, MEd, MSW, MBA)  Occupational History  . Occupation: Patent attorney    Comment: therapist and supervisor   Social Needs  . Financial resource strain: Not hard at all  . Food insecurity:    Worry: Never true    Inability: Never true  . Transportation needs:    Medical: No    Non-medical: No  Tobacco Use  . Smoking status: Never Smoker  . Smokeless tobacco: Never Used  Substance and Sexual Activity  . Alcohol use: Yes    Alcohol/week: 1.0 standard drinks    Types: 1 Glasses of wine per week    Comment: seldom  every 4-5 months  . Drug use: No  . Sexual  activity: Yes    Partners: Male    Birth control/protection: Surgical  Lifestyle  . Physical activity:    Days per week: 0 days    Minutes per session: 0 min  . Stress: Not at all  Relationships  . Social connections:    Talks on phone: More than three times a week    Gets together: Twice a week    Attends religious service: More than 4 times per year    Active member of club or organization: Yes    Attends meetings of clubs or organizations: More than 4 times per year    Relationship status: Married  . Intimate partner violence:    Fear of current or ex partner: No    Emotionally abused: No    Physically abused: No    Forced sexual activity: No  Other Topics Concern  . Not on file  Social History Narrative   Two boys, 14 years apart      Current Outpatient Medications:  .  docusate sodium (COLACE) 100 MG capsule, Take 1 capsule (100 mg total) by mouth 2 (two) times daily. With iron pills for constipation, Disp: 180 capsule, Rfl: 1 .  ferrous sulfate 325 (65 FE) MG tablet, Take 1 tablet (325 mg total) by mouth 2 (two) times daily with a meal., Disp: 180 tablet, Rfl: 1 .  lisinopril-hydrochlorothiazide (PRINZIDE,ZESTORETIC) 10-12.5 MG tablet, Take 1 tablet by mouth daily., Disp: 90 tablet, Rfl: 3 .  naproxen (NAPROSYN) 500 MG tablet, Take 1 tablet (500 mg total) by mouth 2 (two) times daily with a meal., Disp: 30 tablet, Rfl: 0 .  Omega-3 Fatty Acids (FISH OIL) 645 MG CAPS, Take by mouth., Disp: , Rfl:  .  Vitamin D, Ergocalciferol, (DRISDOL) 1.25 MG (50000 UT) CAPS capsule, Take 1 capsule (50,000 Units total) by mouth every 7 (seven) days., Disp: 12 capsule, Rfl: 1  Allergies  Allergen Reactions  . Zantac [Ranitidine Hcl]     I personally reviewed active problem list, medication list, allergies, family history, social history with the patient/caregiver today.   ROS  Constitutional: Negative for fever or weight change.  Respiratory: Negative for cough and shortness of  breath.   Cardiovascular: Negative for chest pain or palpitations.  Gastrointestinal: Negative for abdominal pain, no bowel changes.  Musculoskeletal: Negative for gait problem or joint swelling.  Skin: Negative for rash.  Neurological: Negative for dizziness or headache.  No other specific complaints in a complete review of systems (except as listed in HPI above).  Objective  Vitals:   10/14/18 0759  BP: 118/78  Pulse: 83  Resp: 12  Temp: 97.9 F (36.6 C)  TempSrc: Oral  SpO2: 99%  Weight: 148 lb 9.6 oz (67.4 kg)  Height: 4\' 11"  (1.499 m)    Body mass index is 30.01 kg/m.  Physical Exam  Constitutional: Patient appears well-developed and well-nourished. Obese No distress.  HEENT: head atraumatic, normocephalic, pupils equal and reactive to light,  neck supple, throat within normal limits Cardiovascular: Normal rate, regular rhythm and normal heart sounds.  No murmur heard. No BLE edema. Pulmonary/Chest: Effort normal and breath sounds normal. No respiratory distress. Abdominal: Soft.  There is no tenderness. Psychiatric: Patient has a normal mood and affect. behavior is normal. Judgment and thought content normal.   Recent Results (from the past 2160 hour(s))  COMPLETE METABOLIC PANEL WITH GFR     Status: None   Collection Time: 08/10/18 12:00 AM  Result Value Ref Range   Glucose, Bld 74 65 - 99 mg/dL    Comment: .            Fasting reference interval .    BUN 11 7 - 25 mg/dL   Creat 0.86 0.50 - 1.10 mg/dL   GFR, Est Non African American 79 > OR = 60 mL/min/1.64m2   GFR, Est African American 92 > OR = 60 mL/min/1.49m2   BUN/Creatinine Ratio NOT APPLICABLE 6 - 22 (calc)   Sodium 140 135 - 146 mmol/L   Potassium 4.0 3.5 - 5.3 mmol/L   Chloride 103 98 - 110 mmol/L   CO2 28 20 - 32 mmol/L   Calcium 9.7 8.6 - 10.2 mg/dL   Total Protein 7.5 6.1 - 8.1 g/dL   Albumin 4.8 3.6 - 5.1 g/dL   Globulin 2.7 1.9 - 3.7 g/dL (calc)   AG Ratio 1.8 1.0 - 2.5 (calc)   Total  Bilirubin 0.2 0.2 - 1.2 mg/dL   Alkaline phosphatase (APISO) 57 31 - 125 U/L   AST 18 10 - 35 U/L   ALT 15 6 - 29 U/L  Iron, TIBC and Ferritin Panel     Status: Abnormal   Collection Time: 08/10/18 12:00 AM  Result Value Ref Range   Iron 23 (L) 40 - 190 mcg/dL   TIBC 515 (H) 250 - 450 mcg/dL (calc)   %SAT 4 (L) 16 - 45 % (calc)   Ferritin 5 (L) 16 - 232 ng/mL  CBC with Differential/Platelet     Status: Abnormal   Collection Time: 08/10/18 12:00 AM  Result Value Ref Range   WBC 6.4 3.8 - 10.8 Thousand/uL   RBC 4.18 3.80 - 5.10 Million/uL   Hemoglobin 9.6 (L) 11.7 - 15.5 g/dL   HCT 31.0 (L) 35.0 - 45.0 %   MCV 74.2 (L) 80.0 - 100.0 fL   MCH 23.0 (L) 27.0 - 33.0 pg   MCHC 31.0 (L) 32.0 - 36.0 g/dL   RDW 19.5 (H) 11.0 - 15.0 %   Platelets 567 (H) 140 - 400 Thousand/uL   MPV 10.5 7.5 - 12.5 fL   Neutro Abs 4,371 1,500 - 7,800 cells/uL   Lymphs Abs 1,325 850 - 3,900 cells/uL   Absolute Monocytes 506 200 - 950 cells/uL   Eosinophils Absolute 141 15 - 500 cells/uL   Basophils Absolute 58 0 - 200 cells/uL   Neutrophils Relative % 68.3 %   Total Lymphocyte 20.7 %   Monocytes Relative 7.9 %   Eosinophils Relative 2.2 %   Basophils Relative 0.9 %  Hemoglobin A1c     Status: None   Collection Time: 08/10/18 12:00 AM  Result Value Ref  Range   Hgb A1c MFr Bld 5.4 <5.7 % of total Hgb    Comment: For the purpose of screening for the presence of diabetes: . <5.7%       Consistent with the absence of diabetes 5.7-6.4%    Consistent with increased risk for diabetes             (prediabetes) > or =6.5%  Consistent with diabetes . This assay result is consistent with a decreased risk of diabetes. . Currently, no consensus exists regarding use of hemoglobin A1c for diagnosis of diabetes in children. . According to American Diabetes Association (ADA) guidelines, hemoglobin A1c <7.0% represents optimal control in non-pregnant diabetic patients. Different metrics may apply to specific  patient populations.  Standards of Medical Care in Diabetes(ADA). .    Mean Plasma Glucose 108 (calc)   eAG (mmol/L) 6.0 (calc)  Lipid panel     Status: Abnormal   Collection Time: 08/10/18 12:00 AM  Result Value Ref Range   Cholesterol 220 (H) <200 mg/dL   HDL 57 > OR = 50 mg/dL   Triglycerides 131 <150 mg/dL   LDL Cholesterol (Calc) 137 (H) mg/dL (calc)    Comment: Reference range: <100 . Desirable range <100 mg/dL for primary prevention;   <70 mg/dL for patients with CHD or diabetic patients  with > or = 2 CHD risk factors. Marland Kitchen LDL-C is now calculated using the Martin-Hopkins  calculation, which is a validated novel method providing  better accuracy than the Friedewald equation in the  estimation of LDL-C.  Cresenciano Genre et al. Annamaria Helling. 6270;350(09): 2061-2068  (http://education.[facility].com/faq/FAQ164)    Total CHOL/HDL Ratio 3.9 <5.0 (calc)   Non-HDL Cholesterol (Calc) 163 (H) <130 mg/dL (calc)    Comment: For patients with diabetes plus 1 major ASCVD risk  factor, treating to a non-HDL-C goal of <100 mg/dL  (LDL-C of <70 mg/dL) is considered a therapeutic  option.   Cytology - PAP     Status: None   Collection Time: 08/10/18 12:00 AM  Result Value Ref Range   Adequacy      Satisfactory for evaluation  endocervical/transformation zone component PRESENT.   Diagnosis      NEGATIVE FOR INTRAEPITHELIAL LESIONS OR MALIGNANCY.   HPV NOT DETECTED     Comment: Normal Reference Range - NOT Detected   Material Submitted CervicoVaginal Pap [ThinPrep Imaged]   Hepatitis C antibody     Status: None   Collection Time: 08/10/18 12:00 AM  Result Value Ref Range   Hepatitis C Ab NON-REACTIVE NON-REACTI   SIGNAL TO CUT-OFF 0.17 <1.00    Comment: . HCV antibody was non-reactive. There is no laboratory  evidence of HCV infection. . In most cases, no further action is required. However, if recent HCV exposure is suspected, a test for HCV RNA (test code [phone]) is  suggested. . For additional information please refer to http://education.[facility].com/faq/FAQ22v1 (This link is being provided for informational/ educational purposes only.) .     PHQ2/9: Depression screen [facility] 2/9 10/14/2018 04/15/2018 05/31/2017 12/23/2016 11/03/2016  Decreased Interest 0 0 0 0 0  Down, Depressed, Hopeless 0 0 0 0 0  PHQ - 2 Score 0 0 0 0 0  Altered sleeping 0 0 - - -  Tired, decreased energy 0 0 - - -  Change in appetite 0 0 - - -  Feeling bad or failure about yourself  0 0 - - -  Trouble concentrating 0 0 - - -  Moving slowly or fidgety/restless 0  0 - - -  Suicidal thoughts 0 0 - - -  PHQ-9 Score 0 0 - - -  Difficult doing work/chores Not difficult at all Not difficult at all - - -    phq 9 is negative   Fall Risk: Fall Risk  10/14/2018 08/10/2018 04/15/2018 10/13/2017 05/31/2017  Falls in the past year? 0 0 0 No No  Number falls in past yr: 0 0 - - -  Injury with Fall? 0 0 - - -    Functional Status Survey: Is the patient deaf or have difficulty hearing?: No Does the patient have difficulty seeing, even when wearing glasses/contacts?: No Does the patient have difficulty concentrating, remembering, or making decisions?: No Does the patient have difficulty walking or climbing stairs?: No Does the patient have difficulty dressing or bathing?: No Does the patient have difficulty doing errands alone such as visiting a doctor's office or shopping?: No    Assessment & Plan  1. Essential hypertension  At goal,continue medication   2. Pre-diabetes  She is eating healthy, but needs to increase activity   3. Dyslipidemia  On life style modification only   4. Iron deficiency anemia, unspecified iron deficiency anemia type  Seeing Dr. Grayland Ormond   5. Vitamin D insufficiency  Continue rx vitamin D   6. Obesity (BMI 30.0-34.9)  Discussed with the patient the risk posed by an increased BMI. Discussed importance of portion control, calorie counting  and at least 150 minutes of physical activity weekly. Avoid sweet beverages and drink more water. Eat at least 6 servings of fruit and vegetables daily

## 2018-11-11 ENCOUNTER — Other Ambulatory Visit: Payer: Self-pay

## 2018-11-11 DIAGNOSIS — D509 Iron deficiency anemia, unspecified: Secondary | ICD-10-CM

## 2018-11-13 NOTE — Progress Notes (Deleted)
[facility]  [phone] [phone]) [phone]  ID: SYDNA BRODOWSKI OB: 09-26-1970  MR#: [id number]  ESP#:[id number]  Patient Care Team: Steele Sizer, MD as PCP - General (Family Medicine)  CHIEF COMPLAINT: Iron deficiency anemia  INTERVAL HISTORY: Patient is a 48 year old female who was noted to have significant iron deficiency anemia.  She was placed on oral iron supplementation for greater than 3 months with minimal improvement of her hemoglobin or iron stores.  She had colonoscopy, EGD, and capsule endoscopy in January 2020 did not reveal distinct etiology or significant pathology.  She has mild weakness and fatigue, but otherwise feels well.  She has no neurologic complaints.  She denies any recent fevers or illnesses.  She has a good appetite and denies weight loss.  She has no chest pain or shortness of breath.  She denies any nausea, vomiting, constipation, or diarrhea.  She has no melena or hematochezia.  She has no urinary complaints.  Patient otherwise feels well and offers no further specific complaints.  REVIEW OF SYSTEMS:   Review of Systems  Constitutional: Positive for malaise/fatigue. Negative for fever and weight loss.  Respiratory: Negative.  Negative for cough.   Cardiovascular: Negative.  Negative for chest pain and leg swelling.  Gastrointestinal: Negative.  Negative for abdominal pain, blood in stool and melena.  Genitourinary: Negative.  Negative for hematuria.  Musculoskeletal: Negative.  Negative for back pain.  Skin: Negative.   Neurological: Negative.  Negative for dizziness, focal weakness, weakness and headaches.  Psychiatric/Behavioral: Negative.  The patient is not nervous/anxious.     As per HPI. Otherwise, a complete review of systems is negative.  PAST MEDICAL HISTORY: Past Medical History:  Diagnosis Date  . Essential hypertension, benign     PAST SURGICAL HISTORY: Past Surgical History:  Procedure Laterality  Date  . CESAREAN SECTION    . COLONOSCOPY WITH PROPOFOL N/A 07/08/2018   Procedure: COLONOSCOPY WITH PROPOFOL;  Surgeon: Virgel Manifold, MD;  Location: [facility] ENDOSCOPY;  Service: Endoscopy;  Laterality: N/A;  . ESOPHAGOGASTRODUODENOSCOPY (EGD) WITH PROPOFOL N/A 07/08/2018   Procedure: ESOPHAGOGASTRODUODENOSCOPY (EGD) WITH PROPOFOL;  Surgeon: Virgel Manifold, MD;  Location: [facility] END[facility]COPY;  Service: Endoscopy;  Laterality: N/A;  . TEAR DUCT PROBING      FAMILY HISTORY: Family History  Problem Relation Age of Onset  . Hypertension Mother   . Hyperlipidemia Mother   . Hypertension Father     ADVANCED DIRECTIVES (Y/N):  N  HEALTH MAINTENANCE: Social History   Tobacco Use  . Smoking status: Never Smoker  . Smokeless tobacco: Never Used  Substance Use Topics  . Alcohol use: Yes    Alcohol/week: 1.0 standard drinks    Types: 1 Glasses of wine per week    Comment: seldom  every 4-5 months  . Drug use: No     Colonoscopy:  PAP:  Bone density:  Lipid panel:  Allergies  Allergen Reactions  . Zantac [Ranitidine Hcl]     Current Outpatient Medications  Medication Sig Dispense Refill  . docusate sodium (COLACE) 100 MG capsule Take 1 capsule (100 mg total) by mouth 2 (two) times daily. With iron pills for constipation 180 capsule 1  . ferrous sulfate 325 (65 FE) MG tablet Take 1 tablet (325 mg total) by mouth 2 (two) times daily with a meal. 180 tablet 1  . lisinopril-hydrochlorothiazide (PRINZIDE,ZESTORETIC) 10-12.5 MG tablet Take 1 tablet by mouth daily. 90 tablet 3  . naproxen (NAPROSYN) 500 MG tablet Take 1 tablet (500  mg total) by mouth 2 (two) times daily with a meal. 30 tablet 0  . Omega-3 Fatty Acids (FISH OIL) 645 MG CAPS Take by mouth.    . Vitamin D, Ergocalciferol, (DRISDOL) 1.25 MG (50000 UT) CAPS capsule Take 1 capsule (50,000 Units total) by mouth every 7 (seven) days. 12 capsule 1   No current facility-administered medications for this visit.      OBJECTIVE: There were no vitals filed for this visit.   There is no height or weight on file to calculate BMI.    ECOG FS:0 - Asymptomatic  General: Well-developed, well-nourished, no acute distress. Eyes: Pink conjunctiva, anicteric sclera. HEENT: Normocephalic, moist mucous membranes, clear oropharnyx. Lungs: Clear to auscultation bilaterally. Heart: Regular rate and rhythm. No rubs, murmurs, or gallops. Abdomen: Soft, nontender, nondistended. No organomegaly noted, normoactive bowel sounds. Musculoskeletal: No edema, cyanosis, or clubbing. Neuro: Alert, answering all questions appropriately. Cranial nerves grossly intact. Skin: No rashes or petechiae noted. Psych: Normal affect. Lymphatics: No cervical, calvicular, axillary or inguinal LAD.   LAB RESULTS:  Lab Results  Component Value Date   NA 140 08/10/2018   K 4.0 08/10/2018   CL 103 08/10/2018   CO2 28 08/10/2018   GLUCOSE 74 08/10/2018   BUN 11 08/10/2018   CREATININE 0.86 08/10/2018   CALCIUM 9.7 08/10/2018   PROT 7.5 08/10/2018   ALBUMIN 4.2 05/08/2016   AST 18 08/10/2018   ALT 15 08/10/2018   ALKPHOS 50 05/08/2016   BILITOT 0.2 08/10/2018   GFRNONAA 79 08/10/2018   GFRAA 92 08/10/2018    Lab Results  Component Value Date   WBC 6.4 08/10/2018   NEUTROABS 4,371 08/10/2018   HGB 9.6 (L) 08/10/2018   HCT 31.0 (L) 08/10/2018   MCV 74.2 (L) 08/10/2018   PLT 567 (H) 08/10/2018   Lab Results  Component Value Date   IRON 23 (L) 08/10/2018   TIBC 515 (H) 08/10/2018   IRONPCTSAT 4 (L) 08/10/2018   Lab Results  Component Value Date   FERRITIN 5 (L) 08/10/2018     STUDIES: No results found.  ASSESSMENT: Iron deficiency anemia  PLAN:    1. Iron deficiency anemia: Likely secondary to heavy menses.  Colonoscopy, EGD, and capsule endoscopy in January 2020 did not reveal any distinct pathology.  Patient was tried on a trial of oral iron which did not significantly improve her hemoglobin or iron stores.   Patient will return to clinic later this week for 510 mg IV Feraheme.  She will then return to clinic next week for second infusion.  Patient will return to clinic in 3 months with repeat laboratory work, further evaluation, and consideration of additional IV Feraheme. 2.  Heavy menses: Continue follow-up with gynecology as indicated.  I spent a total of 45 minutes face-to-face with the patient of which greater than 50% of the visit was spent in counseling and coordination of care as detailed above.   Patient expressed understanding and was in agreement with this plan. She also understands that She can call clinic at any time with any questions, concerns, or complaints.   Cancer Staging No matching staging information was found for the patient.  Lloyd Huger, MD   11/13/2018 8:27 AM

## 2018-11-14 ENCOUNTER — Inpatient Hospital Stay: Payer: BLUE CROSS/BLUE SHIELD

## 2018-11-14 ENCOUNTER — Inpatient Hospital Stay: Payer: BLUE CROSS/BLUE SHIELD | Admitting: Oncology

## 2018-11-26 NOTE — Progress Notes (Deleted)
[place]  [phone] [phone]  ID: [name] [name] OB: September 26, 1970  MR#: [id number]  TJQ#:[id number]  Patient Care Team: Steele Sizer, MD as PCP - General (Family Medicine)  CHIEF COMPLAINT: Iron deficiency anemia  INTERVAL HISTORY: Patient is a 48 year old female who was noted to have significant iron deficiency anemia.  She was placed on oral iron supplementation for greater than 3 months with minimal improvement of her hemoglobin or iron stores.  She had colonoscopy, EGD, and capsule endoscopy in January 2020 did not reveal distinct etiology or significant pathology.  She has mild weakness and fatigue, but otherwise feels well.  She has no neurologic complaints.  She denies any recent fevers or illnesses.  She has a good appetite and denies weight loss.  She has no chest pain or shortness of breath.  She denies any nausea, vomiting, constipation, or diarrhea.  She has no melena or hematochezia.  She has no urinary complaints.  Patient otherwise feels well and offers no further specific complaints.  REVIEW OF SYSTEMS:   Review of Systems  Constitutional: Positive for malaise/fatigue. Negative for fever and weight loss.  Respiratory: Negative.  Negative for cough.   Cardiovascular: Negative.  Negative for chest pain and leg swelling.  Gastrointestinal: Negative.  Negative for abdominal pain, blood in stool and melena.  Genitourinary: Negative.  Negative for hematuria.  Musculoskeletal: Negative.  Negative for back pain.  Skin: Negative.   Neurological: Negative.  Negative for dizziness, focal weakness, weakness and headaches.  Psychiatric/Behavioral: Negative.  The patient is not nervous/anxious.     As per HPI. Otherwise, a complete review of systems is negative.  PAST MEDICAL HISTORY: Past Medical History:  Diagnosis Date  . Essential hypertension, benign     PAST SURGICAL HISTORY: Past Surgical History:  Procedure Laterality  Date  . CESAREAN SECTION    . COLONOSCOPY WITH PROPOFOL N/A 07/08/2018   Procedure: COLONOSCOPY WITH PROPOFOL;  Surgeon: Virgel Manifold, MD;  Location: [facility];  Service: Endoscopy;  Laterality: N/A;  . ESOPHAGOGASTRODUODENOSCOPY (EGD) WITH PROPOFOL N/A 07/08/2018   Procedure: ESOPHAGOGASTRODUODENOSCOPY (EGD) WITH PROPOFOL;  Surgeon: Virgel Manifold, MD;  Location: [facility];  Service: Endoscopy;  Laterality: N/A;  . TEAR DUCT PROBING      FAMILY HISTORY: Family History  Problem Relation Age of Onset  . Hypertension Mother   . Hyperlipidemia Mother   . Hypertension Father     ADVANCED DIRECTIVES (Y/N):  N  HEALTH MAINTENANCE: Social History   Tobacco Use  . Smoking status: Never Smoker  . Smokeless tobacco: Never Used  Substance Use Topics  . Alcohol use: Yes    Alcohol/week: 1.0 standard drinks    Types: 1 Glasses of wine per week    Comment: seldom  every 4-5 months  . Drug use: No     Colonoscopy:  PAP:  Bone density:  Lipid panel:  Allergies  Allergen Reactions  . Zantac [Ranitidine Hcl]     Current Outpatient Medications  Medication Sig Dispense Refill  . docusate sodium (COLACE) 100 MG capsule Take 1 capsule (100 mg total) by mouth 2 (two) times daily. With iron pills for constipation 180 capsule 1  . ferrous sulfate 325 (65 FE) MG tablet Take 1 tablet (325 mg total) by mouth 2 (two) times daily with a meal. 180 tablet 1  . lisinopril-hydrochlorothiazide (PRINZIDE,ZESTORETIC) 10-12.5 MG tablet Take 1 tablet by mouth daily. 90 tablet 3  . naproxen (NAPROSYN) 500 MG tablet Take 1 tablet (500  mg total) by mouth 2 (two) times daily with a meal. 30 tablet 0  . Omega-3 Fatty Acids (FISH OIL) 645 MG CAPS Take by mouth.    . Vitamin D, Ergocalciferol, (DRISDOL) 1.25 MG (50000 UT) CAPS capsule Take 1 capsule (50,000 Units total) by mouth every 7 (seven) days. 12 capsule 1   No current facility-administered medications for this visit.      OBJECTIVE: There were no vitals filed for this visit.   There is no height or weight on file to calculate BMI.    ECOG FS:0 - Asymptomatic  General: Well-developed, well-nourished, no acute distress. Eyes: Pink conjunctiva, anicteric sclera. HEENT: Normocephalic, moist mucous membranes, clear oropharnyx. Lungs: Clear to auscultation bilaterally. Heart: Regular rate and rhythm. No rubs, murmurs, or gallops. Abdomen: Soft, nontender, nondistended. No organomegaly noted, normoactive bowel sounds. Musculoskeletal: No edema, cyanosis, or clubbing. Neuro: Alert, answering all questions appropriately. Cranial nerves grossly intact. Skin: No rashes or petechiae noted. Psych: Normal affect. Lymphatics: No cervical, calvicular, axillary or inguinal LAD.   LAB RESULTS:  Lab Results  Component Value Date   NA 140 08/10/2018   K 4.0 08/10/2018   CL 103 08/10/2018   CO2 28 08/10/2018   GLUCOSE 74 08/10/2018   BUN 11 08/10/2018   CREATININE 0.86 08/10/2018   CALCIUM 9.7 08/10/2018   PROT 7.5 08/10/2018   ALBUMIN 4.2 05/08/2016   AST 18 08/10/2018   ALT 15 08/10/2018   ALKPHOS 50 05/08/2016   BILITOT 0.2 08/10/2018   GFRNONAA 79 08/10/2018   GFRAA 92 08/10/2018    Lab Results  Component Value Date   WBC 6.4 08/10/2018   NEUTROABS 4,371 08/10/2018   HGB 9.6 (L) 08/10/2018   HCT 31.0 (L) 08/10/2018   MCV 74.2 (L) 08/10/2018   PLT 567 (H) 08/10/2018   Lab Results  Component Value Date   IRON 23 (L) 08/10/2018   TIBC 515 (H) 08/10/2018   IRONPCTSAT 4 (L) 08/10/2018   Lab Results  Component Value Date   FERRITIN 5 (L) 08/10/2018     STUDIES: No results found.  ASSESSMENT: Iron deficiency anemia  PLAN:    1. Iron deficiency anemia: Likely secondary to heavy menses.  Colonoscopy, EGD, and capsule endoscopy in January 2020 did not reveal any distinct pathology.  Patient was tried on a trial of oral iron which did not significantly improve her hemoglobin or iron stores.   Patient will return to clinic later this week for 510 mg IV Feraheme.  She will then return to clinic next week for second infusion.  Patient will return to clinic in 3 months with repeat laboratory work, further evaluation, and consideration of additional IV Feraheme. 2.  Heavy menses: Continue follow-up with gynecology as indicated.  I spent a total of 45 minutes face-to-face with the patient of which greater than 50% of the visit was spent in counseling and coordination of care as detailed above.   Patient expressed understanding and was in agreement with this plan. She also understands that She can call clinic at any time with any questions, concerns, or complaints.   Cancer Staging No matching staging information was found for the patient.  Lloyd Huger, MD   11/26/2018 7:52 AM

## 2018-12-01 ENCOUNTER — Inpatient Hospital Stay: Payer: BC Managed Care – PPO

## 2018-12-01 ENCOUNTER — Inpatient Hospital Stay: Payer: BC Managed Care – PPO | Admitting: Oncology

## 2018-12-10 NOTE — Progress Notes (Signed)
[place]  [phone] [phone]  ID: CASIDEE JANN OB: Nov 29, 1970  MR#: [id number]  IPJ#:[id number]  Patient Care Team: Steele Sizer, MD as PCP - General (Family Medicine)  CHIEF COMPLAINT: Iron deficiency anemia  INTERVAL HISTORY: Patient returns to clinic today for repeat laboratory work and further evaluation.  She currently feels well and is asymptomatic.  She does not complain of any weakness or fatigue.  She has no neurologic complaints.  She denies any recent fevers or illnesses.  She has a good appetite and denies weight loss.  She denies any chest pain, shortness of breath, cough, or hemoptysis.  She denies any nausea, vomiting, constipation, or diarrhea.  She has no melena or hematochezia.  She has no urinary complaints.  Patient feels at her baseline offers no specific complaints today.  REVIEW OF SYSTEMS:   Review of Systems  Constitutional: Negative.  Negative for fever, malaise/fatigue and weight loss.  Respiratory: Negative.  Negative for cough.   Cardiovascular: Negative.  Negative for chest pain and leg swelling.  Gastrointestinal: Negative.  Negative for abdominal pain, blood in stool and melena.  Genitourinary: Negative.  Negative for hematuria.  Musculoskeletal: Negative.  Negative for back pain.  Skin: Negative.   Neurological: Negative.  Negative for dizziness, focal weakness, weakness and headaches.  Psychiatric/Behavioral: Negative.  The patient is not nervous/anxious.     As per HPI. Otherwise, a complete review of systems is negative.  PAST MEDICAL HISTORY: Past Medical History:  Diagnosis Date  . Essential hypertension, benign     PAST SURGICAL HISTORY: Past Surgical History:  Procedure Laterality Date  . CESAREAN SECTION    . COLONOSCOPY WITH PROPOFOL N/A 07/08/2018   Procedure: COLONOSCOPY WITH PROPOFOL;  Surgeon: Virgel Manifold, MD;  Location: [facility];  Service: Endoscopy;  Laterality:  N/A;  . ESOPHAGOGASTRODUODENOSCOPY (EGD) WITH PROPOFOL N/A 07/08/2018   Procedure: ESOPHAGOGASTRODUODENOSCOPY (EGD) WITH PROPOFOL;  Surgeon: Virgel Manifold, MD;  Location: [facility];  Service: Endoscopy;  Laterality: N/A;  . TEAR DUCT PROBING      FAMILY HISTORY: Family History  Problem Relation Age of Onset  . Hypertension Mother   . Hyperlipidemia Mother   . Hypertension Father     ADVANCED DIRECTIVES (Y/N):  N  HEALTH MAINTENANCE: Social History   Tobacco Use  . Smoking status: Never Smoker  . Smokeless tobacco: Never Used  Substance Use Topics  . Alcohol use: Yes    Alcohol/week: 1.0 standard drinks    Types: 1 Glasses of wine per week    Comment: seldom  every 4-5 months  . Drug use: No     Colonoscopy:  PAP:  Bone density:  Lipid panel:  Allergies  Allergen Reactions  . Zantac [Ranitidine Hcl]     Current Outpatient Medications  Medication Sig Dispense Refill  . docusate sodium (COLACE) 100 MG capsule Take 1 capsule (100 mg total) by mouth 2 (two) times daily. With iron pills for constipation 180 capsule 1  . ferrous sulfate 325 (65 FE) MG tablet Take 1 tablet (325 mg total) by mouth 2 (two) times daily with a meal. 180 tablet 1  . lisinopril-hydrochlorothiazide (PRINZIDE,ZESTORETIC) 10-12.5 MG tablet Take 1 tablet by mouth daily. 90 tablet 3  . Omega-3 Fatty Acids (FISH OIL) 645 MG CAPS Take by mouth.    . Vitamin D, Ergocalciferol, (DRISDOL) 1.25 MG (50000 UT) CAPS capsule Take 1 capsule (50,000 Units total) by mouth every 7 (seven) days. 12 capsule 1  . naproxen (  NAPROSYN) 500 MG tablet Take 1 tablet (500 mg total) by mouth 2 (two) times daily with a meal. (Patient not taking: Reported on 12/16/2018) 30 tablet 0   No current facility-administered medications for this visit.     OBJECTIVE: Vitals:   12/16/18 1328  BP: 110/73  Pulse: (!) 120  Temp: (!) 97.5 F (36.4 C)     Body mass index is 30.3 kg/m.    ECOG FS:0 - Asymptomatic  General:  Well-developed, well-nourished, no acute distress. Eyes: Pink conjunctiva, anicteric sclera. HEENT: Normocephalic, moist mucous membranes. Lungs: Clear to auscultation bilaterally. Heart: Regular rate and rhythm. No rubs, murmurs, or gallops. Abdomen: Soft, nontender, nondistended. No organomegaly noted, normoactive bowel sounds. Musculoskeletal: No edema, cyanosis, or clubbing. Neuro: Alert, answering all questions appropriately. Cranial nerves grossly intact. Skin: No rashes or petechiae noted. Psych: Normal affect.  LAB RESULTS:  Lab Results  Component Value Date   NA 140 08/10/2018   K 4.0 08/10/2018   CL 103 08/10/2018   CO2 28 08/10/2018   GLUCOSE 74 08/10/2018   BUN 11 08/10/2018   CREATININE 0.86 08/10/2018   CALCIUM 9.7 08/10/2018   PROT 7.5 08/10/2018   ALBUMIN 4.2 05/08/2016   AST 18 08/10/2018   ALT 15 08/10/2018   ALKPHOS 50 05/08/2016   BILITOT 0.2 08/10/2018   GFRNONAA 79 08/10/2018   GFRAA 92 08/10/2018    Lab Results  Component Value Date   WBC 7.5 12/16/2018   NEUTROABS 5.5 12/16/2018   HGB 12.5 12/16/2018   HCT 38.5 12/16/2018   MCV 92.1 12/16/2018   PLT 433 (H) 12/16/2018   Lab Results  Component Value Date   IRON 56 12/16/2018   TIBC 371 12/16/2018   IRONPCTSAT 15 12/16/2018   Lab Results  Component Value Date   FERRITIN 42 12/16/2018     STUDIES: No results found.  ASSESSMENT: Iron deficiency anemia  PLAN:    1. Iron deficiency anemia: Likely secondary to heavy menses.  Colonoscopy, EGD, and capsule endoscopy in January 2020 did not reveal any distinct pathology.  Patient was tried on a trial of oral iron which did not significantly improve her hemoglobin or iron stores.  Patient's hemoglobin and iron stores are now within normal limits.  She does not require additional Feraheme today.  Return to clinic in 4 months with repeat laboratory work and further evaluation.   2.  Heavy menses: Continue follow-up with gynecology as indicated.  3.  Thrombocytosis: Mild, monitor.  I spent a total of 20 minutes face-to-face with the patient of which greater than 50% of the visit was spent in counseling and coordination of care as detailed above.    Patient expressed understanding and was in agreement with this plan. She also understands that She can call clinic at any time with any questions, concerns, or complaints.    Lloyd Huger, MD   12/18/2018 9:59 AM

## 2018-12-13 ENCOUNTER — Telehealth: Payer: Self-pay | Admitting: Gastroenterology

## 2018-12-13 NOTE — Telephone Encounter (Signed)
Patient cancelled appt on 12-14-18. She is seeing Dr Massie Maroon for iron infusions. She  wanted to know since he is keeping & eye on her counts does she still need to come?

## 2018-12-14 ENCOUNTER — Ambulatory Visit: Payer: BLUE CROSS/BLUE SHIELD | Admitting: Gastroenterology

## 2018-12-16 ENCOUNTER — Other Ambulatory Visit: Payer: Self-pay

## 2018-12-16 ENCOUNTER — Encounter: Payer: Self-pay | Admitting: Oncology

## 2018-12-16 ENCOUNTER — Inpatient Hospital Stay: Payer: BC Managed Care – PPO

## 2018-12-16 ENCOUNTER — Inpatient Hospital Stay: Payer: BC Managed Care – PPO | Attending: Oncology

## 2018-12-16 ENCOUNTER — Inpatient Hospital Stay (HOSPITAL_BASED_OUTPATIENT_CLINIC_OR_DEPARTMENT_OTHER): Payer: BC Managed Care – PPO | Admitting: Oncology

## 2018-12-16 VITALS — BP 110/73 | HR 120 | Temp 97.5°F | Ht 59.0 in | Wt 150.0 lb

## 2018-12-16 DIAGNOSIS — D509 Iron deficiency anemia, unspecified: Secondary | ICD-10-CM

## 2018-12-16 DIAGNOSIS — Z8249 Family history of ischemic heart disease and other diseases of the circulatory system: Secondary | ICD-10-CM

## 2018-12-16 DIAGNOSIS — Z791 Long term (current) use of non-steroidal anti-inflammatories (NSAID): Secondary | ICD-10-CM | POA: Insufficient documentation

## 2018-12-16 DIAGNOSIS — N920 Excessive and frequent menstruation with regular cycle: Secondary | ICD-10-CM | POA: Insufficient documentation

## 2018-12-16 DIAGNOSIS — Z79899 Other long term (current) drug therapy: Secondary | ICD-10-CM

## 2018-12-16 DIAGNOSIS — I10 Essential (primary) hypertension: Secondary | ICD-10-CM | POA: Diagnosis not present

## 2018-12-16 DIAGNOSIS — R7989 Other specified abnormal findings of blood chemistry: Secondary | ICD-10-CM

## 2018-12-16 LAB — CBC WITH DIFFERENTIAL/PLATELET
Abs Immature Granulocytes: 0.03 10*3/uL (ref 0.00–0.07)
Basophils Absolute: 0.1 10*3/uL (ref 0.0–0.1)
Basophils Relative: 1 %
Eosinophils Absolute: 0.2 10*3/uL (ref 0.0–0.5)
Eosinophils Relative: 2 %
HCT: 38.5 % (ref 36.0–46.0)
Hemoglobin: 12.5 g/dL (ref 12.0–15.0)
Immature Granulocytes: 0 %
Lymphocytes Relative: 17 %
Lymphs Abs: 1.2 10*3/uL (ref 0.7–4.0)
MCH: 29.9 pg (ref 26.0–34.0)
MCHC: 32.5 g/dL (ref 30.0–36.0)
MCV: 92.1 fL (ref 80.0–100.0)
Monocytes Absolute: 0.6 10*3/uL (ref 0.1–1.0)
Monocytes Relative: 7 %
Neutro Abs: 5.5 10*3/uL (ref 1.7–7.7)
Neutrophils Relative %: 73 %
Platelets: 433 10*3/uL — ABNORMAL HIGH (ref 150–400)
RBC: 4.18 MIL/uL (ref 3.87–5.11)
RDW: 15.9 % — ABNORMAL HIGH (ref 11.5–15.5)
WBC: 7.5 10*3/uL (ref 4.0–10.5)
nRBC: 0 % (ref 0.0–0.2)

## 2018-12-16 LAB — IRON AND TIBC
Iron: 56 ug/dL (ref 28–170)
Saturation Ratios: 15 % (ref 10.4–31.8)
TIBC: 371 ug/dL (ref 250–450)
UIBC: 315 ug/dL

## 2018-12-16 LAB — FERRITIN: Ferritin: 42 ng/mL (ref 11–307)

## 2018-12-16 NOTE — Progress Notes (Signed)
Patient stated that she had been doing well with no complaints. 

## 2018-12-30 ENCOUNTER — Other Ambulatory Visit: Payer: Self-pay | Admitting: Family Medicine

## 2019-01-18 ENCOUNTER — Other Ambulatory Visit: Payer: Self-pay | Admitting: Family Medicine

## 2019-03-29 ENCOUNTER — Other Ambulatory Visit: Payer: Self-pay | Admitting: Family Medicine

## 2019-03-29 DIAGNOSIS — I10 Essential (primary) hypertension: Secondary | ICD-10-CM

## 2019-04-10 ENCOUNTER — Ambulatory Visit (INDEPENDENT_AMBULATORY_CARE_PROVIDER_SITE_OTHER): Payer: BC Managed Care – PPO | Admitting: Family Medicine

## 2019-04-10 ENCOUNTER — Encounter: Payer: Self-pay | Admitting: Family Medicine

## 2019-04-10 ENCOUNTER — Other Ambulatory Visit: Payer: Self-pay

## 2019-04-10 VITALS — BP 120/80 | HR 84 | Temp 96.8°F | Resp 16 | Ht 59.0 in | Wt 151.3 lb

## 2019-04-10 DIAGNOSIS — D509 Iron deficiency anemia, unspecified: Secondary | ICD-10-CM | POA: Diagnosis not present

## 2019-04-10 DIAGNOSIS — I10 Essential (primary) hypertension: Secondary | ICD-10-CM | POA: Diagnosis not present

## 2019-04-10 DIAGNOSIS — E669 Obesity, unspecified: Secondary | ICD-10-CM

## 2019-04-10 DIAGNOSIS — E785 Hyperlipidemia, unspecified: Secondary | ICD-10-CM

## 2019-04-10 DIAGNOSIS — N944 Primary dysmenorrhea: Secondary | ICD-10-CM

## 2019-04-10 DIAGNOSIS — E559 Vitamin D deficiency, unspecified: Secondary | ICD-10-CM | POA: Diagnosis not present

## 2019-04-10 DIAGNOSIS — R7303 Prediabetes: Secondary | ICD-10-CM

## 2019-04-10 DIAGNOSIS — E66811 Obesity, class 1: Secondary | ICD-10-CM

## 2019-04-10 DIAGNOSIS — M5386 Other specified dorsopathies, lumbar region: Secondary | ICD-10-CM

## 2019-04-10 DIAGNOSIS — Z8632 Personal history of gestational diabetes: Secondary | ICD-10-CM

## 2019-04-10 MED ORDER — PREGABALIN 50 MG PO CAPS: 50.0000 mg | ORAL_CAPSULE | Freq: Every day | ORAL | 0 refills | Status: DC

## 2019-04-10 MED ORDER — NAPROXEN 500 MG PO TABS: 500.0000 mg | ORAL_TABLET | Freq: Two times a day (BID) | ORAL | 0 refills | Status: DC

## 2019-04-10 MED ORDER — VITAMIN D (ERGOCALCIFEROL) 1.25 MG (50000 UNIT) PO CAPS: 50000.0000 [IU] | ORAL_CAPSULE | ORAL | 1 refills | Status: DC

## 2019-04-10 MED ORDER — LISINOPRIL-HYDROCHLOROTHIAZIDE 10-12.5 MG PO TABS: 1.0000 | ORAL_TABLET | Freq: Every day | ORAL | 0 refills | Status: DC

## 2019-04-10 MED ORDER — FERROUS SULFATE 325 (65 FE) MG PO TBEC: 325.0000 mg | DELAYED_RELEASE_TABLET | Freq: Two times a day (BID) | ORAL | 1 refills | Status: DC

## 2019-04-10 NOTE — Progress Notes (Signed)
Name: [name] [name]   MRN: [id number]    DOB: [birth year]   Date:04/10/2019       Progress Note  Subjective  Chief Complaint  Chief Complaint  Patient presents with  . Back Pain  . Referral  . Follow-up    HTN, HL, Prediabetes. She missed her follow up appointment and wants to do it today.    HPI  Pre-diabetes: there is a family history of DM and she has apersonalhistory of gestational diabetes, denies polyphagia, polydipsia or polyuria. Last hgbA1C was 5.4% previously up to 5.7%  She has been trying to follow a diabetic diet, she has gained 9 lbs since January 2020, she thinks secondary to lack of activity.   Obesity: she has changed her diet, eating more fruit and vegetables, cutting down on bread,eating fish at least twice a week,  She has gained 9 lbs since January but only a few int he past 6 months, she will try to increase physical activity   HTN: she is compliant with medication, denies side effects of medication, bp is at goal, no chest pain, palpitation or dizziness.   Dyslipidemia: discussed lipid panel and her risk is low, no statins at this time The 10-year ASCVD risk score Mikey Bussing DC Brooke Bonito., et al., 2013) is: 2.8%   Values used to calculate the score:     Age: 48 years     Sex: Female     Is Non-Hispanic African American: Yes     Diabetic: No     Tobacco smoker: No     Systolic Blood Pressure: 123456 mmHg     Is BP treated: Yes     HDL Cholesterol: 57 mg/dL     Total Cholesterol: 220 mg/dL  Chronic low back pain: seeing chiropractor - Dr. Freddi Che weekly, but stopped since COVID-19. She states that stretching really helps with her stiffness and pain. She states has pain goes down from left lower back to posterior thigh , down to popliteal fossa. Pain is usually in am, but symptoms resolves during the day. Pain can be 6/10. Currently zero pain because of stretching. She asked to see sub-specialist and asked about surgery, explained since pain is intermittent she is  not a candidate for surgery or injections at this time. Recommended going back to chiropractor, we can add lyrica and continue daily stretching  Anemia found on labs done last year, she states cycles are heavy for 3 days but lasts 7 days, taking iron, seen by Dr. Grayland Ormond and had two infusions. Last HCT done 12/2018 was back to normal, platelets still slightly elevated but greatly improved.   Vitamin D def: she is taking rx vitamin D rx and does not want to recheck levels at this time   Patient Active Problem List   Diagnosis Date Noted  . Special screening for malignant neoplasms, colon   . Rectal polyp   . Diverticulosis of large intestine without diverticulitis   . Hiatal hernia   . Iron deficiency anemia   . Pre-diabetes 10/13/2017  . Intermittent low back pain 10/13/2017  . History of gestational diabetes 10/13/2017  . Hyperlipidemia 05/08/2016  . Essential hypertension 12/20/2014  . Obesity 12/20/2014  . Gastro-esophageal reflux disease without esophagitis 01/25/2010    Past Surgical History:  Procedure Laterality Date  . CESAREAN SECTION    . COLONOSCOPY WITH PROPOFOL N/A 07/08/2018   Procedure: COLONOSCOPY WITH PROPOFOL;  Surgeon: Virgel Manifold, MD;  Location: [facility];  Service: Endoscopy;  Laterality: N/A;  .  ESOPHAGOGASTRODUODENOSCOPY (EGD) WITH PROPOFOL N/A 07/08/2018   Procedure: ESOPHAGOGASTRODUODENOSCOPY (EGD) WITH PROPOFOL;  Surgeon: Virgel Manifold, MD;  Location: [facility];  Service: Endoscopy;  Laterality: N/A;  . TEAR DUCT PROBING      Family History  Problem Relation Age of Onset  . Hypertension Mother   . Hyperlipidemia Mother   . Hypertension Father     Social History   Socioeconomic History  . Marital status: Married    Spouse name: Audry Pili   . Number of children: 2  . Years of education: Not on file  . Highest education level: Master's degree (e.g., MA, MS, MEng, MEd, MSW, MBA)  Occupational History  . Occupation: Games developer    Comment: therapist and supervisor   Social Needs  . Financial resource strain: Not hard at all  . Food insecurity    Worry: Never true    Inability: Never true  . Transportation needs    Medical: No    Non-medical: No  Tobacco Use  . Smoking status: Never Smoker  . Smokeless tobacco: Never Used  Substance and Sexual Activity  . Alcohol use: Yes    Alcohol/week: 1.0 standard drinks    Types: 1 Glasses of wine per week    Comment: seldom  every 4-5 months  . Drug use: No  . Sexual activity: Yes    Partners: Male    Birth control/protection: Surgical  Lifestyle  . Physical activity    Days per week: 0 days    Minutes per session: 0 min  . Stress: Not at all  Relationships  . Social connections    Talks on phone: More than three times a week    Gets together: Twice a week    Attends religious service: More than 4 times per year    Active member of club or organization: Yes    Attends meetings of clubs or organizations: More than 4 times per year    Relationship status: Married  . Intimate partner violence    Fear of current or ex partner: No    Emotionally abused: No    Physically abused: No    Forced sexual activity: No  Other Topics Concern  . Not on file  Social History Narrative   Two boys, 14 years apart      Current Outpatient Medications:  .  DOK 100 MG capsule, TAKE 1 CAPSULE BY MOUTH TWICE A DAY WITH IRON PILLS FOR CONSTIPATION, Disp: 60 capsule, Rfl: 2 .  ferrous sulfate 325 (65 FE) MG EC tablet, TAKE 1 TABLET (325 MG TOTAL) BY MOUTH 2 (TWO) TIMES DAILY WITH A MEAL., Disp: 60 tablet, Rfl: 2 .  lisinopril-hydrochlorothiazide (ZESTORETIC) 10-12.5 MG tablet, TAKE 1 TABLET BY MOUTH DAILY., Disp: 90 tablet, Rfl: 0 .  naproxen (NAPROSYN) 500 MG tablet, Take 1 tablet (500 mg total) by mouth 2 (two) times daily with a meal., Disp: 30 tablet, Rfl: 0 .  Omega-3 Fatty Acids (FISH OIL) 645 MG CAPS, Take by mouth., Disp: , Rfl:  .  Vitamin D,  Ergocalciferol, (DRISDOL) 1.25 MG (50000 UT) CAPS capsule, Take 1 capsule (50,000 Units total) by mouth every 7 (seven) days., Disp: 12 capsule, Rfl: 1  Allergies  Allergen Reactions  . Zantac [Ranitidine Hcl]     I personally reviewed active problem list, medication list, allergies, family history, social history, health maintenance with the patient/caregiver today.   ROS  Constitutional: Negative for fever or weight change.  Respiratory: Negative for cough and  shortness of breath.   Cardiovascular: Negative for chest pain or palpitations.  Gastrointestinal: Negative for abdominal pain, no bowel changes.  Musculoskeletal: Negative for gait problem or joint swelling.  Skin: Negative for rash.  Neurological: Negative for dizziness or headache.  No other specific complaints in a complete review of systems (except as listed in HPI above).  Objective  Vitals:   04/10/19 0822  BP: 120/80  Pulse: 84  Resp: 16  Temp: (!) 96.8 F (36 C)  TempSrc: Temporal  SpO2: 99%  Weight: 151 lb 4.8 oz (68.6 kg)  Height: 4\' 11"  (1.499 m)    Body mass index is 30.56 kg/m.  Physical Exam  Constitutional: Patient appears well-developed and well-nourished. Obese No distress.  HEENT: head atraumatic, normocephalic, pupils equal and reactive to light Cardiovascular: Normal rate, regular rhythm and normal heart sounds.  No murmur heard. No BLE edema. Pulmonary/Chest: Effort normal and breath sounds normal. No respiratory distress. Abdominal: Soft.  There is no tenderness. Psychiatric: Patient has a normal mood and affect. behavior is normal. Judgment and thought content normal. Muscular skeletal: no tenderness during palpitation of lumbar spine, negative straight leg raise , normal rom of spine   PHQ2/9: Depression screen [facility] 2/9 04/10/2019 10/14/2018 04/15/2018 05/31/2017 12/23/2016  Decreased Interest 0 0 0 0 0  Down, Depressed, Hopeless 0 0 0 0 0  PHQ - 2 Score 0 0 0 0 0  Altered sleeping 0 0  0 - -  Tired, decreased energy 0 0 0 - -  Change in appetite 0 0 0 - -  Feeling bad or failure about yourself  0 0 0 - -  Trouble concentrating 0 0 0 - -  Moving slowly or fidgety/restless 0 0 0 - -  Suicidal thoughts 0 0 0 - -  PHQ-9 Score 0 0 0 - -  Difficult doing work/chores - Not difficult at all Not difficult at all - -    phq 9 is negative   Fall Risk: Fall Risk  04/10/2019 10/14/2018 08/10/2018 04/15/2018 10/13/2017  Falls in the past year? 0 0 0 0 No  Number falls in past yr: 0 0 0 - -  Injury with Fall? 0 0 0 - -     Functional Status Survey: Is the patient deaf or have difficulty hearing?: No Does the patient have difficulty seeing, even when wearing glasses/contacts?: No Does the patient have difficulty concentrating, remembering, or making decisions?: No Does the patient have difficulty walking or climbing stairs?: No Does the patient have difficulty dressing or bathing?: No Does the patient have difficulty doing errands alone such as visiting a doctor's office or shopping?: No   Assessment & Plan  1. Essential hypertension  - lisinopril-hydrochlorothiazide (ZESTORETIC) 10-12.5 MG tablet; Take 1 tablet by mouth daily.  Dispense: 90 tablet; Refill: 0  2. Iron deficiency anemia, unspecified iron deficiency anemia type   3. Pre-diabetes  Recheck next visit   4. Vitamin D insufficiency  - Vitamin D, Ergocalciferol, (DRISDOL) 1.25 MG (50000 UT) CAPS capsule; Take 1 capsule (50,000 Units total) by mouth every 7 (seven) days.  Dispense: 12 capsule; Refill: 1  5. Dyslipidemia  Recheck next labs   6. Obesity (BMI 30.0-34.9)  Discussed with the patient the risk posed by an increased BMI. Discussed importance of portion control, calorie counting and at least 150 minutes of physical activity weekly. Avoid sweet beverages and drink more water. Eat at least 6 servings of fruit and vegetables daily   7. History of  gestational diabetes   8. Sciatica associated with  disorder of lumbar spine  - pregabalin (LYRICA) 50 MG capsule; Take 1-2 capsules (50-100 mg total) by mouth at bedtime.  Dispense: 60 capsule; Refill: 0  9. Primary dysmenorrhea  - naproxen (NAPROSYN) 500 MG tablet; Take 1 tablet (500 mg total) by mouth 2 (two) times daily with a meal.  Dispense: 30 tablet; Refill: 0

## 2019-04-14 NOTE — Progress Notes (Deleted)
[place]  [phone] [phone]) [phone]  ID: [name] [name] OB: June 19, 1970  MR#: [id number]  [id number]  Patient Care Team: Steele Sizer, MD as PCP - General (Family Medicine)  CHIEF COMPLAINT: Iron deficiency anemia  INTERVAL HISTORY: Patient returns to clinic today for repeat laboratory work and further evaluation.  She currently feels well and is asymptomatic.  She does not complain of any weakness or fatigue.  She has no neurologic complaints.  She denies any recent fevers or illnesses.  She has a good appetite and denies weight loss.  She denies any chest pain, shortness of breath, cough, or hemoptysis.  She denies any nausea, vomiting, constipation, or diarrhea.  She has no melena or hematochezia.  She has no urinary complaints.  Patient feels at her baseline offers no specific complaints today.  REVIEW OF SYSTEMS:   Review of Systems  Constitutional: Negative.  Negative for fever, malaise/fatigue and weight loss.  Respiratory: Negative.  Negative for cough.   Cardiovascular: Negative.  Negative for chest pain and leg swelling.  Gastrointestinal: Negative.  Negative for abdominal pain, blood in stool and melena.  Genitourinary: Negative.  Negative for hematuria.  Musculoskeletal: Negative.  Negative for back pain.  Skin: Negative.   Neurological: Negative.  Negative for dizziness, focal weakness, weakness and headaches.  Psychiatric/Behavioral: Negative.  The patient is not nervous/anxious.     As per HPI. Otherwise, a complete review of systems is negative.  PAST MEDICAL HISTORY: Past Medical History:  Diagnosis Date  . Essential hypertension, benign     PAST SURGICAL HISTORY: Past Surgical History:  Procedure Laterality Date  . CESAREAN SECTION    . COLONOSCOPY WITH PROPOFOL N/A 07/08/2018   Procedure: COLONOSCOPY WITH PROPOFOL;  Surgeon: Virgel Manifold, MD;  Location: [facility] ENDOSCOPY;  Service: Endoscopy;  Laterality:  N/A;  . ESOPHAGOGASTRODUODENOSCOPY (EGD) WITH PROPOFOL N/A 07/08/2018   Procedure: ESOPHAGOGASTRODUODENOSCOPY (EGD) WITH PROPOFOL;  Surgeon: Virgel Manifold, MD;  Location: [facility] ENDOSCOPY;  Service: Endoscopy;  Laterality: N/A;  . TEAR DUCT PROBING      FAMILY HISTORY: Family History  Problem Relation Age of Onset  . Hypertension Mother   . Hyperlipidemia Mother   . Hypertension Father     ADVANCED DIRECTIVES (Y/N):  N  HEALTH MAINTENANCE: Social History   Tobacco Use  . Smoking status: Never Smoker  . Smokeless tobacco: Never Used  Substance Use Topics  . Alcohol use: Yes    Alcohol/week: 1.0 standard drinks    Types: 1 Glasses of wine per week    Comment: seldom  every 4-5 months  . Drug use: No     Colonoscopy:  PAP:  Bone density:  Lipid panel:  Allergies  Allergen Reactions  . Zantac [Ranitidine Hcl]     Current Outpatient Medications  Medication Sig Dispense Refill  . DOK 100 MG capsule TAKE 1 CAPSULE BY MOUTH TWICE A DAY WITH IRON PILLS FOR CONSTIPATION 60 capsule 2  . ferrous sulfate 325 (65 FE) MG EC tablet Take 1 tablet (325 mg total) by mouth 2 (two) times daily. 180 tablet 1  . lisinopril-hydrochlorothiazide (ZESTORETIC) 10-12.5 MG tablet Take 1 tablet by mouth daily. 90 tablet 0  . naproxen (NAPROSYN) 500 MG tablet Take 1 tablet (500 mg total) by mouth 2 (two) times daily with a meal. 30 tablet 0  . Omega-3 Fatty Acids (FISH OIL) 645 MG CAPS Take by mouth.    . pregabalin (LYRICA) 50 MG capsule Take 1-2 capsules (50-100 mg total)  by mouth at bedtime. 60 capsule 0  . Vitamin D, Ergocalciferol, (DRISDOL) 1.25 MG (50000 UT) CAPS capsule Take 1 capsule (50,000 Units total) by mouth every 7 (seven) days. 12 capsule 1   No current facility-administered medications for this visit.     OBJECTIVE: There were no vitals filed for this visit.   There is no height or weight on file to calculate BMI.    ECOG FS:0 - Asymptomatic  General: Well-developed,  well-nourished, no acute distress. Eyes: Pink conjunctiva, anicteric sclera. HEENT: Normocephalic, moist mucous membranes. Lungs: Clear to auscultation bilaterally. Heart: Regular rate and rhythm. No rubs, murmurs, or gallops. Abdomen: Soft, nontender, nondistended. No organomegaly noted, normoactive bowel sounds. Musculoskeletal: No edema, cyanosis, or clubbing. Neuro: Alert, answering all questions appropriately. Cranial nerves grossly intact. Skin: No rashes or petechiae noted. Psych: Normal affect.  LAB RESULTS:  Lab Results  Component Value Date   NA 140 08/10/2018   K 4.0 08/10/2018   CL 103 08/10/2018   CO2 28 08/10/2018   GLUCOSE 74 08/10/2018   BUN 11 08/10/2018   CREATININE 0.86 08/10/2018   CALCIUM 9.7 08/10/2018   PROT 7.5 08/10/2018   ALBUMIN 4.2 05/08/2016   AST 18 08/10/2018   ALT 15 08/10/2018   ALKPHOS 50 05/08/2016   BILITOT 0.2 08/10/2018   GFRNONAA 79 08/10/2018   GFRAA 92 08/10/2018    Lab Results  Component Value Date   WBC 7.5 12/16/2018   NEUTROABS 5.5 12/16/2018   HGB 12.5 12/16/2018   HCT 38.5 12/16/2018   MCV 92.1 12/16/2018   PLT 433 (H) 12/16/2018   Lab Results  Component Value Date   IRON 56 12/16/2018   TIBC 371 12/16/2018   IRONPCTSAT 15 12/16/2018   Lab Results  Component Value Date   FERRITIN 42 12/16/2018     STUDIES: No results found.  ASSESSMENT: Iron deficiency anemia  PLAN:    1. Iron deficiency anemia: Likely secondary to heavy menses.  Colonoscopy, EGD, and capsule endoscopy in January 2020 did not reveal any distinct pathology.  Patient was tried on a trial of oral iron which did not significantly improve her hemoglobin or iron stores.  Patient's hemoglobin and iron stores are now within normal limits.  She does not require additional Feraheme today.  Return to clinic in 4 months with repeat laboratory work and further evaluation.   2.  Heavy menses: Continue follow-up with gynecology as indicated. 3.   Thrombocytosis: Mild, monitor.  I spent a total of 20 minutes face-to-face with the patient of which greater than 50% of the visit was spent in counseling and coordination of care as detailed above.    Patient expressed understanding and was in agreement with this plan. She also understands that She can call clinic at any time with any questions, concerns, or complaints.    Lloyd Huger, MD   04/14/2019 2:24 PM

## 2019-04-17 ENCOUNTER — Ambulatory Visit: Payer: BLUE CROSS/BLUE SHIELD | Admitting: Family Medicine

## 2019-04-17 NOTE — Progress Notes (Deleted)
Called patient no answer left message  

## 2019-04-18 ENCOUNTER — Inpatient Hospital Stay: Payer: BC Managed Care – PPO

## 2019-04-18 ENCOUNTER — Inpatient Hospital Stay: Payer: BC Managed Care – PPO | Admitting: Oncology

## 2019-05-11 ENCOUNTER — Other Ambulatory Visit: Payer: Self-pay | Admitting: Family Medicine

## 2019-05-11 ENCOUNTER — Telehealth: Payer: Self-pay | Admitting: Emergency Medicine

## 2019-05-11 DIAGNOSIS — M5386 Other specified dorsopathies, lumbar region: Secondary | ICD-10-CM

## 2019-05-11 NOTE — Telephone Encounter (Signed)
Pt left message during lunch requesting to make appt. Scheduling message sent to scheduling team.

## 2019-05-11 NOTE — Telephone Encounter (Signed)
Requested medication (s) are due for refill today: yes  Requested medication (s) are on the active medication list: yes  Last refill:  04/10/2019  Future visit scheduled: yes  Notes to clinic: refill cannot be delegated    Requested Prescriptions  Pending Prescriptions Disp Refills   pregabalin (LYRICA) 50 MG capsule [Pharmacy Med Name: PREGABALIN 50MG  CAP] 60 capsule     Sig: Take 1-2 capsules (50-100 mg total) by mouth at bedtime.     Not Delegated - Neurology:  Anticonvulsants - Controlled Failed - 05/11/2019  2:44 PM      Failed - This refill cannot be delegated      Passed - Valid encounter within last 12 months    Recent Outpatient Visits          1 month ago Essential hypertension   [facility] Steele Sizer, MD   6 months ago Essential hypertension   [facility] Steele Sizer, MD   9 months ago Well woman exam   [facility] Steele Sizer, MD   1 year ago Essential hypertension   [facility] Steele Sizer, MD   1 year ago Essential hypertension   [facility] Steele Sizer, MD      Future Appointments            In 1 week Grayland Ormond, Kathlene November, MD [facility]   In 5 months Steele Sizer, MD [facility], [facility]

## 2019-05-12 NOTE — Telephone Encounter (Signed)
Patient is taking one capsule at night.

## 2019-05-21 NOTE — Progress Notes (Signed)
[place]  Telephone:(336) [phone] [phone]) [phone]  ID: [name] [name] OB: [birth year]  MR#: [id number]  [id number]  Patient Care Team: Steele Sizer, MD as PCP - General (Family Medicine)  I connected with [name] [name] on 05/24/19 at  3:15 PM EST by video enabled telemedicine visit and verified that I am speaking with the correct person using two identifiers.   I discussed the limitations, risks, security and privacy concerns of performing an evaluation and management service by telemedicine and the availability of in-person appointments. I also discussed with the patient that there may be a patient responsible charge related to this service. The patient expressed understanding and agreed to proceed.   Other persons participating in the visit and their role in the encounter: Patient, MD.  Patient's location: Home. Provider's location: Clinic.  CHIEF COMPLAINT: Iron deficiency anemia  INTERVAL HISTORY: Patient agreed to video enabled telemedicine visit for further evaluation and discussion of her laboratory work.  She continues to feel well and remains asymptomatic.  She does not complain of any weakness or fatigue today.  She has no neurologic complaints.  She denies any recent fevers or illnesses.  She has a good appetite and denies weight loss.  She denies any chest pain, shortness of breath, cough, or hemoptysis.  She denies any nausea, vomiting, constipation, or diarrhea.  She has no melena or hematochezia.  She has no urinary complaints.  Patient offers no specific complaints today.  REVIEW OF SYSTEMS:   Review of Systems  Constitutional: Negative.  Negative for fever, malaise/fatigue and weight loss.  Respiratory: Negative.  Negative for cough.   Cardiovascular: Negative.  Negative for chest pain and leg swelling.  Gastrointestinal: Negative.  Negative for abdominal pain, blood in stool and melena.  Genitourinary: Negative.  Negative for  hematuria.  Musculoskeletal: Negative.  Negative for back pain.  Skin: Negative.   Neurological: Negative.  Negative for dizziness, focal weakness, weakness and headaches.  Psychiatric/Behavioral: Negative.  The patient is not nervous/anxious.     As per HPI. Otherwise, a complete review of systems is negative.  PAST MEDICAL HISTORY: Past Medical History:  Diagnosis Date  . Essential hypertension, benign     PAST SURGICAL HISTORY: Past Surgical History:  Procedure Laterality Date  . CESAREAN SECTION    . COLONOSCOPY WITH PROPOFOL N/A 07/08/2018   Procedure: COLONOSCOPY WITH PROPOFOL;  Surgeon: Virgel Manifold, MD;  Location: [facility] ENDOSCOPY;  Service: Endoscopy;  Laterality: N/A;  . ESOPHAGOGASTRODUODENOSCOPY (EGD) WITH PROPOFOL N/A 07/08/2018   Procedure: ESOPHAGOGASTRODUODENOSCOPY (EGD) WITH PROPOFOL;  Surgeon: Virgel Manifold, MD;  Location: [facility] ENDOSCOPY;  Service: Endoscopy;  Laterality: N/A;  . TEAR DUCT PROBING      FAMILY HISTORY: Family History  Problem Relation Age of Onset  . Hypertension Mother   . Hyperlipidemia Mother   . Hypertension Father     ADVANCED DIRECTIVES (Y/N):  N  HEALTH MAINTENANCE: Social History   Tobacco Use  . Smoking status: Never Smoker  . Smokeless tobacco: Never Used  Substance Use Topics  . Alcohol use: Yes    Alcohol/week: 1.0 standard drinks    Types: 1 Glasses of wine per week    Comment: seldom  every 4-5 months  . Drug use: No     Colonoscopy:  PAP:  Bone density:  Lipid panel:  Allergies  Allergen Reactions  . Zantac [Ranitidine Hcl]     Current Outpatient Medications  Medication Sig Dispense Refill  . DOK 100 MG capsule TAKE  1 CAPSULE BY MOUTH TWICE A DAY WITH IRON PILLS FOR CONSTIPATION 60 capsule 2  . ferrous sulfate 325 (65 FE) MG EC tablet Take 1 tablet (325 mg total) by mouth 2 (two) times daily. 180 tablet 1  . lisinopril-hydrochlorothiazide (ZESTORETIC) 10-12.5 MG tablet Take 1 tablet by mouth  daily. 90 tablet 0  . naproxen (NAPROSYN) 500 MG tablet Take 1 tablet (500 mg total) by mouth 2 (two) times daily with a meal. 30 tablet 0  . Omega-3 Fatty Acids (FISH OIL) 645 MG CAPS Take by mouth.    . pregabalin (LYRICA) 50 MG capsule TAKE 1-2 CAPSULES (50-100 MG TOTAL) BY MOUTH AT BEDTIME. 90 capsule 0  . Vitamin D, Ergocalciferol, (DRISDOL) 1.25 MG (50000 UT) CAPS capsule Take 1 capsule (50,000 Units total) by mouth every 7 (seven) days. 12 capsule 1   No current facility-administered medications for this visit.    OBJECTIVE: There were no vitals filed for this visit.   There is no height or weight on file to calculate BMI.    ECOG FS:0 - Asymptomatic  General: Well-developed, well-nourished, no acute distress. HEENT: Normocephalic. Neuro: Alert, answering all questions appropriately. Cranial nerves grossly intact. Psych: Normal affect.   LAB RESULTS:  Lab Results  Component Value Date   NA 140 08/10/2018   K 4.0 08/10/2018   CL 103 08/10/2018   CO2 28 08/10/2018   GLUCOSE 74 08/10/2018   BUN 11 08/10/2018   CREATININE 0.86 08/10/2018   CALCIUM 9.7 08/10/2018   PROT 7.5 08/10/2018   ALBUMIN 4.2 05/08/2016   AST 18 08/10/2018   ALT 15 08/10/2018   ALKPHOS 50 05/08/2016   BILITOT 0.2 08/10/2018   GFRNONAA 79 08/10/2018   GFRAA 92 08/10/2018    Lab Results  Component Value Date   WBC 8.7 05/22/2019   NEUTROABS 6.5 05/22/2019   HGB 11.7 (L) 05/22/2019   HCT 37.2 05/22/2019   MCV 91.2 05/22/2019   PLT 456 (H) 05/22/2019   Lab Results  Component Value Date   IRON 45 05/22/2019   TIBC 436 05/22/2019   IRONPCTSAT 10 (L) 05/22/2019   Lab Results  Component Value Date   FERRITIN 11 05/22/2019     STUDIES: No results found.  ASSESSMENT: Iron deficiency anemia  PLAN:    1. Iron deficiency anemia: Likely secondary to heavy menses.  Colonoscopy, EGD, and capsule endoscopy in January 2020 did not reveal any distinct pathology.  Patient was tried on a trial  of oral iron which did not significantly improve her hemoglobin or iron stores.  Patient's hemoglobin and iron stores have trended down slightly, but she remains asymptomatic.  She does not require any Feraheme at this time, but will likely require treatment at her next clinic visit.  Return to clinic in 3 months with repeat laboratory work, further evaluation, and consideration of IV Feraheme.   2.  Heavy menses: Continue follow-up with gynecology as indicated. 3.  Thrombocytosis: Mild, monitor.  Likely secondary to iron deficiency.  I provided 15 minutes of face-to-face video visit time during this encounter which included chart review. Greater than 50% was spent counseling as documented under my assessment & plan.   Patient expressed understanding and was in agreement with this plan. She also understands that She can call clinic at any time with any questions, concerns, or complaints.    Lloyd Huger, MD   05/24/2019 7:01 AM

## 2019-05-22 ENCOUNTER — Other Ambulatory Visit: Payer: Self-pay

## 2019-05-22 ENCOUNTER — Inpatient Hospital Stay: Payer: BC Managed Care – PPO | Attending: Oncology

## 2019-05-22 DIAGNOSIS — Z8249 Family history of ischemic heart disease and other diseases of the circulatory system: Secondary | ICD-10-CM | POA: Diagnosis not present

## 2019-05-22 DIAGNOSIS — N920 Excessive and frequent menstruation with regular cycle: Secondary | ICD-10-CM | POA: Diagnosis not present

## 2019-05-22 DIAGNOSIS — Z79899 Other long term (current) drug therapy: Secondary | ICD-10-CM | POA: Insufficient documentation

## 2019-05-22 DIAGNOSIS — I10 Essential (primary) hypertension: Secondary | ICD-10-CM | POA: Diagnosis not present

## 2019-05-22 DIAGNOSIS — Z83438 Family history of other disorder of lipoprotein metabolism and other lipidemia: Secondary | ICD-10-CM | POA: Insufficient documentation

## 2019-05-22 DIAGNOSIS — R7989 Other specified abnormal findings of blood chemistry: Secondary | ICD-10-CM | POA: Insufficient documentation

## 2019-05-22 DIAGNOSIS — D500 Iron deficiency anemia secondary to blood loss (chronic): Secondary | ICD-10-CM | POA: Diagnosis not present

## 2019-05-22 DIAGNOSIS — D509 Iron deficiency anemia, unspecified: Secondary | ICD-10-CM

## 2019-05-22 LAB — CBC WITH DIFFERENTIAL/PLATELET
Abs Immature Granulocytes: 0.02 10*3/uL (ref 0.00–0.07)
Basophils Absolute: 0.1 10*3/uL (ref 0.0–0.1)
Basophils Relative: 1 %
Eosinophils Absolute: 0.2 10*3/uL (ref 0.0–0.5)
Eosinophils Relative: 2 %
HCT: 37.2 % (ref 36.0–46.0)
Hemoglobin: 11.7 g/dL — ABNORMAL LOW (ref 12.0–15.0)
Immature Granulocytes: 0 %
Lymphocytes Relative: 15 %
Lymphs Abs: 1.3 10*3/uL (ref 0.7–4.0)
MCH: 28.7 pg (ref 26.0–34.0)
MCHC: 31.5 g/dL (ref 30.0–36.0)
MCV: 91.2 fL (ref 80.0–100.0)
Monocytes Absolute: 0.6 10*3/uL (ref 0.1–1.0)
Monocytes Relative: 7 %
Neutro Abs: 6.5 10*3/uL (ref 1.7–7.7)
Neutrophils Relative %: 75 %
Platelets: 456 10*3/uL — ABNORMAL HIGH (ref 150–400)
RBC: 4.08 MIL/uL (ref 3.87–5.11)
RDW: 14.2 % (ref 11.5–15.5)
WBC: 8.7 10*3/uL (ref 4.0–10.5)
nRBC: 0 % (ref 0.0–0.2)

## 2019-05-22 LAB — IRON AND TIBC
Iron: 45 ug/dL (ref 28–170)
Saturation Ratios: 10 % — ABNORMAL LOW (ref 10.4–31.8)
TIBC: 436 ug/dL (ref 250–450)
UIBC: 391 ug/dL

## 2019-05-22 LAB — FERRITIN: Ferritin: 11 ng/mL (ref 11–307)

## 2019-05-23 ENCOUNTER — Inpatient Hospital Stay (HOSPITAL_BASED_OUTPATIENT_CLINIC_OR_DEPARTMENT_OTHER): Payer: BC Managed Care – PPO | Admitting: Oncology

## 2019-05-23 DIAGNOSIS — D509 Iron deficiency anemia, unspecified: Secondary | ICD-10-CM

## 2019-06-06 ENCOUNTER — Other Ambulatory Visit: Payer: Self-pay | Admitting: Family Medicine

## 2019-06-06 NOTE — Telephone Encounter (Signed)
Requested medication (s) are due for refill today: yes  Requested medication (s) are on the active medication list: yes  Last refill:  05/11/2019  Future visit scheduled: yes  Notes to clinic:  pharmacy note "maximum refills reached"    Requested Prescriptions  Pending Prescriptions Disp Refills   STOOL SOFTENER 100 MG capsule [Pharmacy Med Name: STOOL SOFTNR 100MG  CAP] 60 capsule 2    Sig: TAKE 1 CAPSULE BY MOUTH TWICE A DAY WITH IRON PILLS FOR CONSTIPATION      Over the Counter:  OTC Passed - 06/06/2019  1:28 PM      Passed - Valid encounter within last 12 months    Recent Outpatient Visits           1 month ago Essential hypertension   [facility] Steele Sizer, MD   7 months ago Essential hypertension   [facility] Steele Sizer, MD   10 months ago Well woman exam   [facility] Steele Sizer, MD   1 year ago Essential hypertension   [facility] Steele Sizer, MD   1 year ago Essential hypertension   [facility] Steele Sizer, MD       Future Appointments             In 4 months Ancil Boozer, Drue Stager, MD [facility], [facility]

## 2019-06-12 ENCOUNTER — Other Ambulatory Visit: Payer: Self-pay | Admitting: Family Medicine

## 2019-06-12 DIAGNOSIS — Z1239 Encounter for other screening for malignant neoplasm of breast: Secondary | ICD-10-CM

## 2019-07-01 ENCOUNTER — Encounter: Payer: Self-pay | Admitting: Emergency Medicine

## 2019-07-01 ENCOUNTER — Other Ambulatory Visit: Payer: Self-pay

## 2019-07-01 ENCOUNTER — Ambulatory Visit
Admission: EM | Admit: 2019-07-01 | Discharge: 2019-07-01 | Disposition: A | Discharge status: Home / Self Care | Payer: BC Managed Care – PPO | Attending: Family Medicine | Admitting: Family Medicine

## 2019-07-01 DIAGNOSIS — Z20822 Contact with and (suspected) exposure to covid-19: Secondary | ICD-10-CM | POA: Diagnosis not present

## 2019-07-01 DIAGNOSIS — U071 COVID-19: Secondary | ICD-10-CM | POA: Insufficient documentation

## 2019-07-01 DIAGNOSIS — Z7189 Other specified counseling: Secondary | ICD-10-CM | POA: Insufficient documentation

## 2019-07-01 NOTE — Discharge Instructions (Addendum)
It was very nice seeing you today in clinic. Thank you for entrusting me with your care.   You were tested for SARS-CoV-2 (novel coronavirus) today. Testing is performed by an outside lab ([facility]) and has variable turn around times ranging between 2-5 days. Current recommendations from the the CDC and [place] DHHS require that you remain out of work in order to quarantine at home until negative test results are have been received. In the event that your test results are positive, you will be contacted with further directives. These measures are being implemented out of an abundance of caution to prevent transmission and spread during the current SARS-CoV-2 pandemic.   If you develop any worsening symptoms or concerns, make arrangements to follow up with your regular doctor. If your symptoms are severe, please seek follow up care in the ER. Please remember, our [place] providers are "right here with you" when you need us.   Again, it was my pleasure to take care of you today. Thank you for choosing our clinic. I hope that you start to feel better quickly.   Darol Cush, MSN, APRN, FNP-C, CEN Advanced Practice Provider [facility]  

## 2019-07-01 NOTE — ED Provider Notes (Signed)
[name], [name]   Name: [name] [name] DOB: May 23, 1971 MRN: [id number] CSN: [id number] PCP: Steele Sizer, MD  Arrival date and time:  07/01/19 0819  Chief Complaint:  COVID Exposure (no symptoms)   NOTE: Prior to seeing the patient today, I have reviewed the triage nursing documentation and vital signs. Clinical staff has updated patient's PMH/PSHx, current medication list, and drug allergies/intolerances to ensure comprehensive history available to assist in medical decision making.   History:   HPI: [name] [name] is a 49 y.o. female who presents today with complaints of recent direct exposure to SARS-CoV-2 (novel coronavirus). Known exposure reported to have occurred over the course of the last week. Patient's husband tested positive yesterday. He became symptomatic on Wednesday (06/28/19). When patient's husband began to feel bad, she quarantine herself and her son to prevent contact. Patient presents today with no symptoms; no cough, fevers, or other symptoms commonly associated with SARS-CoV-2. She advises that she feels generally well. Patient presents for testing out of concern for her personal health. Patient has never been tested for SARS-CoV-2 in the past. She has received her annual influenza vaccination this year.   Past Medical History:  Diagnosis Date  . Essential hypertension, benign     Past Surgical History:  Procedure Laterality Date  . CESAREAN SECTION    . COLONOSCOPY WITH PROPOFOL N/A 07/08/2018   Procedure: COLONOSCOPY WITH PROPOFOL;  Surgeon: Virgel Manifold, MD;  Location: [facility] END[facility]COPY;  Service: Endoscopy;  Laterality: N/A;  . ESOPHAGOGASTRODUODENOSCOPY (EGD) WITH PROPOFOL N/A 07/08/2018   Procedure: ESOPHAGOGASTRODUODENOSCOPY (EGD) WITH PROPOFOL;  Surgeon: Virgel Manifold, MD;  Location: [facility] END[facility]COPY;  Service: Endoscopy;  Laterality: N/A;  . TEAR DUCT PROBING      Family History  Problem Relation Age of Onset  . Hypertension Mother    . Hyperlipidemia Mother   . Hypertension Father     Social History   Tobacco Use  . Smoking status: Never Smoker  . Smokeless tobacco: Never Used  Substance Use Topics  . Alcohol use: Yes    Alcohol/week: 1.0 standard drinks    Types: 1 Glasses of wine per week    Comment: seldom  every 4-5 months  . Drug use: No    Patient Active Problem List   Diagnosis Date Noted  . Special screening for malignant neoplasms, colon   . Rectal polyp   . Diverticulosis of large intestine without diverticulitis   . Hiatal hernia   . Iron deficiency anemia   . Pre-diabetes 10/13/2017  . Intermittent low back pain 10/13/2017  . History of gestational diabetes 10/13/2017  . Hyperlipidemia 05/08/2016  . Essential hypertension 12/20/2014  . Obesity 12/20/2014  . Gastro-esophageal reflux disease without esophagitis 01/25/2010    Home Medications:    Current Meds  Medication Sig  . ferrous sulfate 325 (65 FE) MG EC tablet Take 1 tablet (325 mg total) by mouth 2 (two) times daily.  Marland Kitchen lisinopril-hydrochlorothiazide (ZESTORETIC) 10-12.5 MG tablet Take 1 tablet by mouth daily.  . Omega-3 Fatty Acids (FISH OIL) 645 MG CAPS Take by mouth.  . Vitamin D, Ergocalciferol, (DRISDOL) 1.25 MG (50000 UT) CAPS capsule Take 1 capsule (50,000 Units total) by mouth every 7 (seven) days.    Allergies:   Zantac [ranitidine hcl]  Review of Systems (ROS): Review of Systems  Constitutional: Negative for fatigue and fever.  HENT: Negative for congestion, ear pain, postnasal drip, rhinorrhea, sinus pressure, sinus pain, sneezing and sore throat.   Eyes: Negative for pain,  discharge and redness.  Respiratory: Negative for cough, chest tightness and shortness of breath.   Cardiovascular: Negative for chest pain and palpitations.  Gastrointestinal: Negative for abdominal pain, diarrhea, nausea and vomiting.  Musculoskeletal: Negative for arthralgias, back pain, myalgias and neck pain.  Skin: Negative for color  change, pallor and rash.  Neurological: Negative for dizziness, syncope, weakness and headaches.  Hematological: Negative for adenopathy.   Vital Signs: Today's Vitals   07/01/19 0837 07/01/19 0839 07/01/19 0912  BP:  130/80   Pulse:  (!) 101   Resp:  14   Temp:  98.6 F (37 C)   TempSrc:  Oral   SpO2:  97%   Weight: 150 lb (68 kg)    Height: 5' (1.524 m)    PainSc: 0-No pain  0-No pain    Physical Exam: Physical Exam  Constitutional: Oriented to person, place, and time and well-developed, well-nourished, and in no distress.  HENT:  Head: Normocephalic and atraumatic.  Eyes: Pupils are equal, round, and reactive to light.  Cardiovascular: Normal rate, regular rhythm, normal heart sounds and intact distal pulses.  Pulmonary/Chest: Effort normal and breath sounds normal.  Neurological: She is alert and oriented to person, place, and time. Gait normal.  Skin: Skin is warm and dry. No rash noted. Not diaphoretic.  Psychiatric: Mood, memory, affect and judgment normal.  Nursing note and vitals reviewed.  Urgent Care Treatments / Results:   Orders Placed This Encounter  Procedures  . Novel Coronavirus, NAA (Hosp order, Send-out to Ref Lab; TAT 18-24 hrs    LABS: PLEASE NOTE: all labs that were ordered this encounter are listed, however only abnormal results are displayed. Labs Reviewed  NOVEL CORONAVIRUS, NAA (HOSP ORDER, SEND-OUT TO REF LAB; TAT 18-24 HRS)    EKG: -None  RADIOLOGY: No results found.  PROCEDURES: Procedures  MEDICATIONS RECEIVED THIS VISIT: Medications - No data to display  PERTINENT CLINICAL COURSE NOTES/UPDATES:   Initial Impression / Assessment and Plan / Urgent Care Course:  Pertinent labs & imaging results that were available during my care of the patient were personally reviewed by me and considered in my medical decision making (see lab/imaging section of note for values and interpretations).  [name] [name] is a 49 y.o. female who  presents to [facility] today with complaints of COVID Exposure (no symptoms)  Patient overall well appearing and in no acute distress today in clinic. Presenting symptoms (see HPI) and exam as documented above. She presents following a direct exposure to SARS-CoV-2 (novel coronavirus). Discussed typical symptom constellation and patient reaffirms that she has none of the commonly associated symptoms seen with SARS-CoV-2 infection. Reviewed potential for infection with recent close contact. Given exposure and potential for infection, testing is reasonable. SARS-CoV-2 swab collected by certified clinical staff. Discussed variable turn around times associated with testing, as swabs are being processed at [facility], and have been between 24-48 hours to come back. She was advised to self quarantine, per [facility] DHHS guidelines, until negative results received.   Discussed follow up with primary care physician should she develop any concerning symptoms. I have reviewed the follow up and strict return precautions for any new or worsening symptoms. Patient is aware of symptoms that would be deemed urgent/emergent, and would thus require further evaluation either here or in the emergency department. At the time of discharge, he verbalized understanding and consent with the discharge plan as it was reviewed with him. All questions were fielded by provider and/or clinic staff  prior to patient discharge.    Final Clinical Impressions / Urgent Care Diagnoses:   Final diagnoses:  Exposure to COVID-19 virus  Encounter for laboratory testing for COVID-19 virus  Advice given about COVID-19 virus infection    New Prescriptions:  [place] Controlled Substance Registry consulted? Not Applicable  No orders of the defined types were placed in this encounter.   Recommended Follow up Care:  Patient encouraged to follow up with the following provider within the specified time frame, or sooner as dictated by the severity of  her symptoms. As always, she was instructed that for any urgent/emergent care needs, she should seek care either here or in the emergency department for more immediate evaluation.  Follow-up Information    Steele Sizer, MD.   Specialty: Family Medicine Why: As needed Contact information: [address] [place] [address] [phone]         NOTE: This note was prepared using Dragon dictation software along with smaller phrase technology. Despite my best ability to proofread, there is the potential that transcriptional errors may still occur from this process, and are completely unintentional.    Karen Kitchens, NP 07/01/19 [phone]

## 2019-07-01 NOTE — ED Triage Notes (Signed)
Patient states that her husband tested positive for COVID yesterday.  Patient denies any symptoms.

## 2019-07-03 ENCOUNTER — Telehealth (HOSPITAL_COMMUNITY): Payer: Self-pay | Admitting: Emergency Medicine

## 2019-07-03 ENCOUNTER — Encounter (HOSPITAL_COMMUNITY): Payer: Self-pay

## 2019-07-03 NOTE — Telephone Encounter (Signed)
Your test for COVID-19 was positive, meaning that you were infected with the novel coronavirus and could give the germ to others.  Please continue isolation at home for at least 10 days since the start of your symptoms. If you do not have symptoms, please isolate at home for 10 days from the day you were tested. Once you complete your 10 day quarantine, you may return to normal activities as long as you've not had a fever for over 24 hours(without taking fever reducing medicine) and your symptoms are improving. Please continue good preventive care measures, including:  frequent hand-washing, avoid touching your face, cover coughs/sneezes, stay out of crowds and keep a 6 foot distance from others.  Go to the nearest hospital emergency room if fever/cough/breathlessness are severe or illness seems like a threat to life.  Your son [name] [name] also tested positive for covid.  Attempted to reach patient. No answer at this time. Voicemail left.   If you have any questions, you may call me at [phone]

## 2019-07-03 NOTE — Telephone Encounter (Signed)
error 

## 2019-07-04 LAB — NOVEL CORONAVIRUS, NAA (HOSP ORDER, SEND-OUT TO REF LAB; TAT 18-24 HRS): SARS-CoV-2, NAA: DETECTED — AB

## 2019-08-02 ENCOUNTER — Other Ambulatory Visit: Payer: Self-pay | Admitting: Family Medicine

## 2019-08-02 DIAGNOSIS — M5386 Other specified dorsopathies, lumbar region: Secondary | ICD-10-CM

## 2019-08-11 ENCOUNTER — Ambulatory Visit: Payer: BC Managed Care – PPO | Admitting: Family Medicine

## 2019-08-11 ENCOUNTER — Other Ambulatory Visit: Payer: Self-pay

## 2019-08-11 ENCOUNTER — Encounter: Payer: Self-pay | Admitting: Family Medicine

## 2019-08-11 VITALS — BP 130/78 | HR 98 | Temp 97.1°F | Resp 16 | Ht 59.0 in | Wt 151.7 lb

## 2019-08-11 DIAGNOSIS — E785 Hyperlipidemia, unspecified: Secondary | ICD-10-CM | POA: Diagnosis not present

## 2019-08-11 DIAGNOSIS — K449 Diaphragmatic hernia without obstruction or gangrene: Secondary | ICD-10-CM

## 2019-08-11 DIAGNOSIS — D509 Iron deficiency anemia, unspecified: Secondary | ICD-10-CM | POA: Diagnosis not present

## 2019-08-11 DIAGNOSIS — E559 Vitamin D deficiency, unspecified: Secondary | ICD-10-CM

## 2019-08-11 DIAGNOSIS — R7303 Prediabetes: Secondary | ICD-10-CM | POA: Diagnosis not present

## 2019-08-11 DIAGNOSIS — R1013 Epigastric pain: Secondary | ICD-10-CM | POA: Diagnosis not present

## 2019-08-11 DIAGNOSIS — I10 Essential (primary) hypertension: Secondary | ICD-10-CM | POA: Diagnosis not present

## 2019-08-11 DIAGNOSIS — D473 Essential (hemorrhagic) thrombocythemia: Secondary | ICD-10-CM

## 2019-08-11 DIAGNOSIS — D75839 Thrombocytosis, unspecified: Secondary | ICD-10-CM

## 2019-08-11 MED ORDER — VITAMIN D (ERGOCALCIFEROL) 1.25 MG (50000 UNIT) PO CAPS: 50000.0000 [IU] | ORAL_CAPSULE | ORAL | 1 refills | Status: DC

## 2019-08-11 MED ORDER — LISINOPRIL-HYDROCHLOROTHIAZIDE 10-12.5 MG PO TABS: 1.0000 | ORAL_TABLET | Freq: Every day | ORAL | 1 refills | Status: DC

## 2019-08-11 NOTE — Progress Notes (Signed)
Name: [name] [name]   MRN: [id number]    DOB: 1971/06/08   Date:08/11/2019       Progress Note  Subjective  Chief Complaint  Chief Complaint  Patient presents with  . Abdominal Pain    Onset-couple of weeks in her left upper quandrant, feels like a movement has previous dx of hiatal hernia   . Bloated    HPI  Bloating: she has a history of hiatal hernia, she had a EGD and colonoscopy last year for evaluation of iron deficiency anemia and it was negative. She is feeling a weird sensation on left upper quadrant over the pas month and getting worse over the past few weeks. She has bloating, feeling gassy. She has some tightness on the left side at times, like spasms  Pre-diabetes: there is a family history of DM and she has apersonalhistory of gestational diabetes, denies polyphagia, polydipsia or polyuria. Last hgbA1C was down again at 5.4%   Obesity: she is mindful about her diet, avoids sodas, she gained weight recently and states not making the best choices recently but she will resume it now.   HTN: she is compliant with medication, denies side effects of medication, bp is at goal, no chest pain, palpitation or dizziness, bp is at goal today .   Dyslipidemia: discussed lipid panel and her risk is low, no statins at this time  The 10-year ASCVD risk score Mikey Bussing DC Brooke Bonito., et al., 2013) is: 3.8%   Values used to calculate the score:     Age: 49 years     Sex: Female     Is Non-Hispanic African American: Yes     Diabetic: No     Tobacco smoker: No     Systolic Blood Pressure: [id number] mmHg     Is BP treated: Yes     HDL Cholesterol: 57 mg/dL     Total Cholesterol: 220 mg/dL  Chronic low back pain: seeing chiropractor - Dr. Freddi Che weekly, but stopped since COVID-19. She states that stretching really helps with her stiffness and pain. She states has pain goes down from left lower back to posterior thigh , down to popliteal fossa. Pain is usually in am, but symptoms resolves during  the day. Pain can be 6/10. Currently zero pain because of stretching. She asked to see sub-specialist and asked about surgery, explained since pain is intermittent she is not a candidate for surgery or injections at this time. Recommended going back to chiropractor, we can add lyrica and continue daily stretching  Anemia found on labs done last year, she states cycles are heavy for 3 days but lasts 7 days,taking iron, seen by Dr. Grayland Ormond and had two infusions. LMP 05/2019, she is peri-menopause and may improve her anemia. She had a negative EGD and colonoscopy in 2020. Currently taking iron but usually once a day , advised to try second one at lunch   Vitamin D def: she is taking rx vitamin D and would like a refill   Patient Active Problem List   Diagnosis Date Noted  . Special screening for malignant neoplasms, colon   . Rectal polyp   . Diverticulosis of large intestine without diverticulitis   . Hiatal hernia   . Iron deficiency anemia   . Pre-diabetes 10/13/2017  . Intermittent low back pain 10/13/2017  . History of gestational diabetes 10/13/2017  . Hyperlipidemia 05/08/2016  . Essential hypertension 12/20/2014  . Obesity 12/20/2014  . Gastro-esophageal reflux disease without esophagitis 01/25/2010    Past  Surgical History:  Procedure Laterality Date  . CESAREAN SECTION    . COLONOSCOPY WITH PROPOFOL N/A 07/08/2018   Procedure: COLONOSCOPY WITH PROPOFOL;  Surgeon: Virgel Manifold, MD;  Location: [facility] END[facility];  Service: Endoscopy;  Laterality: N/A;  . ESOPHAGOGASTRODUODENOSCOPY (EGD) WITH PROPOFOL N/A 07/08/2018   Procedure: ESOPHAGOGASTRODUODENOSCOPY (EGD) WITH PROPOFOL;  Surgeon: Virgel Manifold, MD;  Location: [facility] END[facility]COPY;  Service: Endoscopy;  Laterality: N/A;  . TEAR DUCT PROBING      Family History  Problem Relation Age of Onset  . Hypertension Mother   . Hyperlipidemia Mother   . Hypertension Father     Social History   Tobacco Use  . Smoking  status: Never Smoker  . Smokeless tobacco: Never Used  Substance Use Topics  . Alcohol use: Yes    Alcohol/week: 1.0 standard drinks    Types: 1 Glasses of wine per week    Comment: seldom  every 4-5 months     Current Outpatient Medications:  .  ferrous sulfate 325 (65 FE) MG EC tablet, Take 1 tablet (325 mg total) by mouth 2 (two) times daily., Disp: 180 tablet, Rfl: 1 .  lisinopril-hydrochlorothiazide (ZESTORETIC) 10-12.5 MG tablet, Take 1 tablet by mouth daily., Disp: 90 tablet, Rfl: 0 .  naproxen (NAPROSYN) 500 MG tablet, Take 1 tablet (500 mg total) by mouth 2 (two) times daily with a meal., Disp: 30 tablet, Rfl: 0 .  Omega-3 Fatty Acids (FISH OIL) 645 MG CAPS, Take by mouth., Disp: , Rfl:  .  STOOL SOFTENER 100 MG capsule, TAKE 1 CAPSULE BY MOUTH TWICE A DAY WITH IRON PILLS FOR CONSTIPATION, Disp: 60 capsule, Rfl: 2 .  Vitamin D, Ergocalciferol, (DRISDOL) 1.25 MG (50000 UT) CAPS capsule, Take 1 capsule (50,000 Units total) by mouth every 7 (seven) days., Disp: 12 capsule, Rfl: 1  Allergies  Allergen Reactions  . Zantac [Ranitidine Hcl]     I personally reviewed active problem list, medication list, allergies, family history, social history, health maintenance with the patient/caregiver today.   ROS  Constitutional: Negative for fever or significant  weight change.  Respiratory: Negative for cough and shortness of breath.   Cardiovascular: Negative for chest pain or palpitations.  Gastrointestinal: Positive for abdominal pain, but no  bowel changes.  Musculoskeletal: Negative for gait problem or joint swelling.  Skin: Negative for rash.  Neurological: Negative for dizziness or headache.  No other specific complaints in a complete review of systems (except as listed in HPI above).   Objective  Vitals:   08/11/19 1150  BP: 130/78  Pulse: 98  Resp: 16  Temp: (!) 97.1 F (36.2 C)  TempSrc: Temporal  SpO2: 98%  Weight: 151 lb 11.2 oz (68.8 kg)  Height: 4\' 11"  (1.499  m)    Body mass index is 30.64 kg/m.  Physical Exam  Constitutional: Patient appears well-developed and well-nourished. Obese No distress.  HEENT: head atraumatic, normocephalic, pupils equal and reactive to light Cardiovascular: Normal rate, regular rhythm and normal heart sounds.  No murmur heard. No BLE edema. Pulmonary/Chest: Effort normal and breath sounds normal. No respiratory distress. Abdominal: Soft.  There is no tenderness. No masses, normal bowel sounds Psychiatric: Patient has a normal mood and affect. behavior is normal. Judgment and thought content normal.  Recent Results (from the past 2160 hour(s))  Iron and TIBC     Status: Abnormal   Collection Time: 05/22/19  3:22 PM  Result Value Ref Range   Iron 45 28 - 170 ug/dL  TIBC 436 250 - 450 ug/dL   Saturation Ratios 10 (L) 10.4 - 31.8 %   UIBC 391 ug/dL    Comment: Performed at [facility], [address] [place], [place] [address]  Ferritin     Status: None   Collection Time: 05/22/19  3:22 PM  Result Value Ref Range   Ferritin 11 11 - 307 ng/mL    Comment: Performed at [facility], [address] [place], [place] [address]  CBC with Differential     Status: Abnormal   Collection Time: 05/22/19  3:22 PM  Result Value Ref Range   WBC 8.7 4.0 - 10.5 K/uL   RBC 4.08 3.87 - 5.11 MIL/uL   Hemoglobin 11.7 (L) 12.0 - 15.0 g/dL   HCT 37.2 36.0 - 46.0 %   MCV 91.2 80.0 - 100.0 fL   MCH 28.7 26.0 - 34.0 pg   MCHC 31.5 30.0 - 36.0 g/dL   RDW 14.2 11.5 - 15.5 %   Platelets 456 (H) 150 - 400 K/uL   nRBC 0.0 0.0 - 0.2 %   Neutrophils Relative % 75 %   Neutro Abs 6.5 1.7 - 7.7 K/uL   Lymphocytes Relative 15 %   Lymphs Abs 1.3 0.7 - 4.0 K/uL   Monocytes Relative 7 %   Monocytes Absolute 0.6 0.1 - 1.0 K/uL   Eosinophils Relative 2 %   Eosinophils Absolute 0.2 0.0 - 0.5 K/uL   Basophils Relative 1 %   Basophils Absolute 0.1 0.0 - 0.1 K/uL   Immature Granulocytes 0 %   Abs Immature  Granulocytes 0.02 0.00 - 0.07 K/uL    Comment: Performed at [facility], [address] [place], [place] [address]  Novel Coronavirus, NAA (Hosp order, Send-out to Ref Lab; TAT 18-24 hrs     Status: Abnormal   Collection Time: 07/01/19  8:39 AM   Specimen: Nasopharyngeal Swab; Respiratory  Result Value Ref Range   SARS-CoV-2, NAA DETECTED (A) NOT DETECTED    Comment: RESULT CALLED TO, READ BACK BY AND VERIFIED WITH: NOTIFIED MELISSA HADLEY 07/04/2019 1107 ACR (NOTE)                  Client Requested Flag This nucleic acid amplification test was developed and its performance characteristics determined by [employer]. Nucleic acid amplification tests include RT-PCR and TMA. This test has not been FDA cleared or approved. This test has been authorized by FDA under an Emergency Use Authorization (EUA). This test is only authorized for the duration of time the declaration that circumstances exist justifying the authorization of the emergency use of in vitro diagnostic tests for detection of SARS-CoV-2 virus and/or diagnosis of COVID-19 infection under section 564(b)(1) of the Act, 21 U.S.C. [id number]) (1), unless the authorization is terminated or revoked sooner. When diagnostic testing is negative, the possibility of a false negative result should be considered in the context of a patient's recent exposures and the presence o f clinical signs and symptoms consistent with COVID-19. An individual without symptoms of COVID- 19 and who is not shedding SARS-CoV-2 virus would expect to have a negative (not detected) result in this assay. Performed At: [facility] [place], [place] [id number] Rush Farmer MD [id number] Performed at [facility], [address] [place], [place] [address]    Coronavirus Source NASOPHARYNGEAL     Comment: Performed at [facility], [address] [place],  [address]      PHQ2/9:  Depression screen [facility] 2/9 08/11/2019 04/10/2019 10/14/2018 04/15/2018 05/31/2017  Decreased Interest 0 0 0 0 0  Down, Depressed, Hopeless 0 0 0 0 0  PHQ - 2 Score 0 0 0 0 0  Altered sleeping 0 0 0 0 -  Tired, decreased energy 0 0 0 0 -  Change in appetite 0 0 0 0 -  Feeling bad or failure about yourself  0 0 0 0 -  Trouble concentrating 0 0 0 0 -  Moving slowly or fidgety/restless 0 0 0 0 -  Suicidal thoughts 0 0 0 0 -  PHQ-9 Score 0 0 0 0 -  Difficult doing work/chores Not difficult at all - Not difficult at all Not difficult at all -    phq 9 is negative   Fall Risk: Fall Risk  08/11/2019 04/10/2019 10/14/2018 08/10/2018 04/15/2018  Falls in the past year? 0 0 0 0 0  Number falls in past yr: 0 0 0 0 -  Injury with Fall? 0 0 0 0 -    Functional Status Survey: Is the patient deaf or have difficulty hearing?: No Does the patient have difficulty seeing, even when wearing glasses/contacts?: No Does the patient have difficulty concentrating, remembering, or making decisions?: No Does the patient have difficulty walking or climbing stairs?: No Does the patient have difficulty dressing or bathing?: No Does the patient have difficulty doing errands alone such as visiting a doctor's office or shopping?: No    Assessment & Plan  1. Pre-diabetes  - Hemoglobin A1c  2. Essential hypertension  - lisinopril-hydrochlorothiazide (ZESTORETIC) 10-12.5 MG tablet; Take 1 tablet by mouth daily.  Dispense: 90 tablet; Refill: 1 - COMPLETE METABOLIC PANEL WITH GFR  3. Iron deficiency anemia, unspecified iron deficiency anemia type  She has follow up with Dr. Grayland Ormond  4. Dyslipidemia  - Lipid panel  5. Vitamin D insufficiency  Refill Vitamin D   6. Hiatal hernia  Explained what it is   7. Thrombocytosis ([place])  Monitored by Dr. Grayland Ormond   8. Dyspepsia  - H. pylori breath test

## 2019-08-11 NOTE — Patient Instructions (Signed)
Hiatal Hernia  A hiatal hernia occurs when part of the stomach slides above the muscle that separates the abdomen from the chest (diaphragm). A person can be born with a hiatal hernia (congenital), or it may develop over time. In almost all cases of hiatal hernia, only the top part of the stomach pushes through the diaphragm. Many people have a hiatal hernia with no symptoms. The larger the hernia, the more likely it is that you will have symptoms. In some cases, a hiatal hernia allows stomach acid to flow back into the tube that carries food from your mouth to your stomach (esophagus). This may cause heartburn symptoms. Severe heartburn symptoms may mean that you have developed a condition called gastroesophageal reflux disease (GERD). What are the causes? This condition is caused by a weakness in the opening (hiatus) where the esophagus passes through the diaphragm to attach to the upper part of the stomach. A person may be born with a weakness in the hiatus, or a weakness can develop over time. What increases the risk? This condition is more likely to develop in:  Older people. Age is a major risk factor for a hiatal hernia, especially if you are over the age of 50.  Pregnant women.  People who are overweight.  People who have frequent constipation. What are the signs or symptoms? Symptoms of this condition usually develop in the form of GERD symptoms. Symptoms include:  Heartburn.  Belching.  Indigestion.  Trouble swallowing.  Coughing or wheezing.  Sore throat.  Hoarseness.  Chest pain.  Nausea and vomiting. How is this diagnosed? This condition may be diagnosed during testing for GERD. Tests that may be done include:  X-rays of your stomach or chest.  An upper gastrointestinal (GI) series. This is an X-ray exam of your GI tract that is taken after you swallow a chalky liquid that shows up clearly on the X-ray.  Endoscopy. This is a procedure to look into your stomach  using a thin, flexible tube that has a tiny camera and light on the end of it. How is this treated? This condition may be treated by:  Dietary and lifestyle changes to help reduce GERD symptoms.  Medicines. These may include: ? Over-the-counter antacids. ? Medicines that make your stomach empty more quickly. ? Medicines that block the production of stomach acid (H2 blockers). ? Stronger medicines to reduce stomach acid (proton pump inhibitors).  Surgery to repair the hernia, if other treatments are not helping. If you have no symptoms, you may not need treatment. Follow these instructions at home: Lifestyle and activity  Do not use any products that contain nicotine or tobacco, such as cigarettes and e-cigarettes. If you need help quitting, ask your health care provider.  Try to achieve and maintain a healthy body weight.  Avoid putting pressure on your abdomen. Anything that puts pressure on your abdomen increases the amount of acid that may be pushed up into your esophagus. ? Avoid bending over, especially after eating. ? Raise the head of your bed by putting blocks under the legs. This keeps your head and esophagus higher than your stomach. ? Do not wear tight clothing around your chest or stomach. ? Try not to strain when having a bowel movement, when urinating, or when lifting heavy objects. Eating and drinking  Avoid foods that can worsen GERD symptoms. These may include: ? Fatty foods, like fried foods. ? Citrus fruits, like oranges or lemon. ? Other foods and drinks that contain acid, like   orange juice or tomatoes. ? Spicy food. ? Chocolate.  Eat frequent small meals instead of three large meals a day. This helps prevent your stomach from getting too full. ? Eat slowly. ? Do not lie down right after eating. ? Do not eat 1-2 hours before bed.  Do not drink beverages with caffeine. These include cola, coffee, cocoa, and tea.  Do not drink alcohol. General  instructions  Take over-the-counter and prescription medicines only as told by your health care provider.  Keep all follow-up visits as told by your health care provider. This is important. Contact a health care provider if:  Your symptoms are not controlled with medicines or lifestyle changes.  You are having trouble swallowing.  You have coughing or wheezing that will not go away. Get help right away if:  Your pain is getting worse.  Your pain spreads to your arms, neck, jaw, teeth, or back.  You have shortness of breath.  You sweat for no reason.  You feel sick to your stomach (nauseous) or you vomit.  You vomit blood.  You have bright red blood in your stools.  You have black, tarry stools. This information is not intended to replace advice given to you by your health care provider. Make sure you discuss any questions you have with your health care provider. Document Revised: 05/07/2017 Document Reviewed: 12/28/2016 Elsevier Patient Education  2020 Elsevier Inc.  

## 2019-08-14 ENCOUNTER — Other Ambulatory Visit: Payer: Self-pay | Admitting: Family Medicine

## 2019-08-14 ENCOUNTER — Telehealth: Payer: Self-pay

## 2019-08-14 LAB — COMPLETE METABOLIC PANEL WITH GFR
AG Ratio: 1.6 (calc) (ref 1.0–2.5)
ALT: 12 U/L (ref 6–29)
AST: 17 U/L (ref 10–35)
Albumin: 4.6 g/dL (ref 3.6–5.1)
Alkaline phosphatase (APISO): 56 U/L (ref 37–153)
BUN: 14 mg/dL (ref 7–25)
CO2: 26 mmol/L (ref 20–32)
Calcium: 10.2 mg/dL (ref 8.6–10.4)
Chloride: 104 mmol/L (ref 98–110)
Creat: 0.87 mg/dL (ref 0.50–1.05)
GFR, Est African American: 90 mL/min/{1.73_m2} (ref >=60)
GFR, Est Non African American: 78 mL/min/{1.73_m2} (ref >=60)
Globulin: 2.9 g/dL (calc) (ref 1.9–3.7)
Glucose, Bld: 85 mg/dL (ref 65–99)
Potassium: 4.1 mmol/L (ref 3.5–5.3)
Sodium: 140 mmol/L (ref 135–146)
Total Bilirubin: 0.3 mg/dL (ref 0.2–1.2)
Total Protein: 7.5 g/dL (ref 6.1–8.1)

## 2019-08-14 LAB — LIPID PANEL
Cholesterol: 233 mg/dL — ABNORMAL HIGH (ref <200)
HDL: 49 mg/dL — ABNORMAL LOW (ref >=50)
LDL Cholesterol (Calc): 160 mg/dL (calc) — ABNORMAL HIGH
Non-HDL Cholesterol (Calc): 184 mg/dL (calc) — ABNORMAL HIGH (ref <130)
Total CHOL/HDL Ratio: 4.8 (calc) (ref <5.0)
Triglycerides: 121 mg/dL (ref <150)

## 2019-08-14 LAB — HEMOGLOBIN A1C
Hgb A1c MFr Bld: 6.1 % of total Hgb — ABNORMAL HIGH (ref <5.7)
Mean Plasma Glucose: 128 (calc)
eAG (mmol/L): 7.1 (calc)

## 2019-08-14 LAB — H. PYLORI BREATH TEST: H. pylori Breath Test: NOT DETECTED

## 2019-08-14 MED ORDER — OMEPRAZOLE 40 MG PO CPDR: 40.0000 mg | DELAYED_RELEASE_CAPSULE | Freq: Every day | ORAL | 1 refills | Status: DC

## 2019-08-14 NOTE — Telephone Encounter (Signed)
Copied from [place] [phone]. Topic: General - Inquiry >> Aug 14, 2019  3:19 PM Virl Axe D wrote: Reason for CRM: Pt would like to speak with a nurse to go over her recent lab results. No notes giving P[facility] permission. Please return call to pt.

## 2019-08-17 NOTE — Progress Notes (Signed)
[facility]  [phone] [phone]  ID: [name] TROUSDALE OB: 08-Jun-1971  MR#: [id number]  [id number]  Patient Care Team: Steele Sizer, MD as PCP - General (Family Medicine)   CHIEF COMPLAINT: Iron deficiency anemia  INTERVAL HISTORY: Patient returns to clinic today for repeat laboratory work and further evaluation.  She continues to feel well and remains asymptomatic.  She denies any weakness or fatigue.  She continues to be active and work full-time. She has no neurologic complaints.  She denies any recent fevers or illnesses.  She has a good appetite and denies weight loss.  She denies any chest pain, shortness of breath, cough, or hemoptysis.  She denies any nausea, vomiting, constipation, or diarrhea.  She has no melena or hematochezia.  She has no urinary complaints.  Patient feels that her baseline offers no specific complaints today.  REVIEW OF SYSTEMS:   Review of Systems  Constitutional: Negative.  Negative for fever, malaise/fatigue and weight loss.  Respiratory: Negative.  Negative for cough.   Cardiovascular: Negative.  Negative for chest pain and leg swelling.  Gastrointestinal: Negative.  Negative for abdominal pain, blood in stool and melena.  Genitourinary: Negative.  Negative for hematuria.  Musculoskeletal: Negative.  Negative for back pain.  Skin: Negative.   Neurological: Negative.  Negative for dizziness, focal weakness, weakness and headaches.  Psychiatric/Behavioral: Negative.  The patient is not nervous/anxious.     As per HPI. Otherwise, a complete review of systems is negative.  PAST MEDICAL HISTORY: Past Medical History:  Diagnosis Date  . Essential hypertension, benign     PAST SURGICAL HISTORY: Past Surgical History:  Procedure Laterality Date  . CESAREAN SECTION    . COLONOSCOPY WITH PROPOFOL N/A 07/08/2018   Procedure: COLONOSCOPY WITH PROPOFOL;  Surgeon: Virgel Manifold, MD;  Location: [facility]  ENDOSCOPY;  Service: Endoscopy;  Laterality: N/A;  . ESOPHAGOGASTRODUODENOSCOPY (EGD) WITH PROPOFOL N/A 07/08/2018   Procedure: ESOPHAGOGASTRODUODENOSCOPY (EGD) WITH PROPOFOL;  Surgeon: Virgel Manifold, MD;  Location: [facility] ENDOSCOPY;  Service: Endoscopy;  Laterality: N/A;  . TEAR DUCT PROBING      FAMILY HISTORY: Family History  Problem Relation Age of Onset  . Hypertension Mother   . Hyperlipidemia Mother   . Hypertension Father     ADVANCED DIRECTIVES (Y/N):  N  HEALTH MAINTENANCE: Social History   Tobacco Use  . Smoking status: Never Smoker  . Smokeless tobacco: Never Used  Substance Use Topics  . Alcohol use: Yes    Alcohol/week: 1.0 standard drinks    Types: 1 Glasses of wine per week    Comment: seldom  every 4-5 months  . Drug use: No     Colonoscopy:  PAP:  Bone density:  Lipid panel:  Allergies  Allergen Reactions  . Zantac [Ranitidine Hcl]     Current Outpatient Medications  Medication Sig Dispense Refill  . ferrous sulfate 325 (65 FE) MG EC tablet Take 1 tablet (325 mg total) by mouth 2 (two) times daily. 180 tablet 1  . lisinopril-hydrochlorothiazide (ZESTORETIC) 10-12.5 MG tablet Take 1 tablet by mouth daily. 90 tablet 1  . naproxen (NAPROSYN) 500 MG tablet Take 1 tablet (500 mg total) by mouth 2 (two) times daily with a meal. (Patient taking differently: Take 500 mg by mouth as needed. ) 30 tablet 0  . Omega-3 Fatty Acids (FISH OIL) 645 MG CAPS Take by mouth.    Mar[place] Kitchen omeprazole (PRILOSEC) 40 MG capsule Take 1 capsule (40 mg total) by mouth daily. [place]  capsule 1  . pregabalin (LYRICA) 50 MG capsule Take 50 mg by mouth at bedtime.    . STOOL SOFTENER 100 MG capsule TAKE 1 CAPSULE BY MOUTH TWICE A DAY WITH IRON PILLS FOR CONSTIPATION 60 capsule 2  . Vitamin D, Ergocalciferol, (DRISDOL) 1.25 MG (50000 UNIT) CAPS capsule Take 1 capsule (50,000 Units total) by mouth every 7 (seven) days. 12 capsule 1   No current facility-administered medications for this  visit.    OBJECTIVE: Vitals:   08/22/19 1312  BP: 111/78  Pulse: 98  Temp: (!) 97.1 F (36.2 C)     Body mass index is 31.14 kg/m.    ECOG FS:0 - Asymptomatic  General: Well-developed, well-nourished, no acute distress. Eyes: Pink conjunctiva, anicteric sclera. HEENT: Normocephalic, moist mucous membranes. Lungs: No audible wheezing or coughing. Heart: Regular rate and rhythm. Abdomen: Soft, nontender, no obvious distention. Musculoskeletal: No edema, cyanosis, or clubbing. Neuro: Alert, answering all questions appropriately. Cranial nerves grossly intact. Skin: No rashes or petechiae noted. Psych: Normal affect.  LAB RESULTS:  Lab Results  Component Value Date   NA 140 08/11/2019   K 4.1 08/11/2019   CL 104 08/11/2019   CO2 26 08/11/2019   GLUCOSE 85 08/11/2019   BUN 14 08/11/2019   CREATININE 0.87 08/11/2019   CALCIUM 10.2 08/11/2019   PROT 7.5 08/11/2019   ALBUMIN 4.2 05/08/2016   AST 17 08/11/2019   ALT 12 08/11/2019   ALKPHOS 50 05/08/2016   BILITOT 0.3 08/11/2019   GFRNONAA 78 08/11/2019   GFRAA 90 08/11/2019    Lab Results  Component Value Date   WBC 8.4 08/21/2019   NEUTROABS 6.0 08/21/2019   HGB 11.5 (L) 08/21/2019   HCT 34.9 (L) 08/21/2019   MCV 86.2 08/21/2019   PLT 372 08/21/2019   Lab Results  Component Value Date   IRON 65 08/21/2019   TIBC 392 08/21/2019   IRONPCTSAT 17 08/21/2019   Lab Results  Component Value Date   FERRITIN 25 08/21/2019     STUDIES: No results found.  ASSESSMENT: Iron deficiency anemia  PLAN:    1. Iron deficiency anemia: Likely secondary to heavy menses.  Colonoscopy, EGD, and capsule endoscopy in January 2020 did not reveal any distinct pathology.  Patient continues to take oral iron supplementation.  Although hemoglobin has trended down slightly to 11.5, her iron stores have improved and are now within normal limits.  She also remains asymptomatic.  No intervention is needed at this time.  Patient does  not require IV iron.  She has been instructed to continue taking oral iron supplementation 1-2 times per day.  No further interventions are needed.  No follow-up has been scheduled.  Please refer patient back if there are any questions or concerns.   2.  Heavy menses: Continue follow-up with gynecology as indicated. 3.  Thrombocytosis: Resolved.   Patient expressed understanding and was in agreement with this plan. She also understands that She can call clinic at any time with any questions, concerns, or complaints.    Lloyd Huger, MD   08/22/2019 3:31 PM

## 2019-08-21 ENCOUNTER — Other Ambulatory Visit: Payer: Self-pay

## 2019-08-21 ENCOUNTER — Inpatient Hospital Stay: Payer: BC Managed Care – PPO | Attending: Oncology

## 2019-08-21 DIAGNOSIS — Z8349 Family history of other endocrine, nutritional and metabolic diseases: Secondary | ICD-10-CM | POA: Insufficient documentation

## 2019-08-21 DIAGNOSIS — D509 Iron deficiency anemia, unspecified: Secondary | ICD-10-CM | POA: Diagnosis not present

## 2019-08-21 DIAGNOSIS — Z7289 Other problems related to lifestyle: Secondary | ICD-10-CM | POA: Diagnosis not present

## 2019-08-21 DIAGNOSIS — Z8249 Family history of ischemic heart disease and other diseases of the circulatory system: Secondary | ICD-10-CM | POA: Diagnosis not present

## 2019-08-21 DIAGNOSIS — Z79899 Other long term (current) drug therapy: Secondary | ICD-10-CM | POA: Diagnosis not present

## 2019-08-21 DIAGNOSIS — I10 Essential (primary) hypertension: Secondary | ICD-10-CM | POA: Diagnosis not present

## 2019-08-21 DIAGNOSIS — N920 Excessive and frequent menstruation with regular cycle: Secondary | ICD-10-CM | POA: Diagnosis not present

## 2019-08-21 LAB — CBC WITH DIFFERENTIAL/PLATELET
Abs Immature Granulocytes: 0.02 10*3/uL (ref 0.00–0.07)
Basophils Absolute: 0 10*3/uL (ref 0.0–0.1)
Basophils Relative: 1 %
Eosinophils Absolute: 0.2 10*3/uL (ref 0.0–0.5)
Eosinophils Relative: 2 %
HCT: 34.9 % — ABNORMAL LOW (ref 36.0–46.0)
Hemoglobin: 11.5 g/dL — ABNORMAL LOW (ref 12.0–15.0)
Immature Granulocytes: 0 %
Lymphocytes Relative: 18 %
Lymphs Abs: 1.5 10*3/uL (ref 0.7–4.0)
MCH: 28.4 pg (ref 26.0–34.0)
MCHC: 33 g/dL (ref 30.0–36.0)
MCV: 86.2 fL (ref 80.0–100.0)
Monocytes Absolute: 0.7 10*3/uL (ref 0.1–1.0)
Monocytes Relative: 8 %
Neutro Abs: 6 10*3/uL (ref 1.7–7.7)
Neutrophils Relative %: 71 %
Platelets: 372 10*3/uL (ref 150–400)
RBC: 4.05 MIL/uL (ref 3.87–5.11)
RDW: 14.7 % (ref 11.5–15.5)
WBC: 8.4 10*3/uL (ref 4.0–10.5)
nRBC: 0 % (ref 0.0–0.2)

## 2019-08-21 LAB — IRON AND TIBC
Iron: 65 ug/dL (ref 28–170)
Saturation Ratios: 17 % (ref 10.4–31.8)
TIBC: 392 ug/dL (ref 250–450)
UIBC: 327 ug/dL

## 2019-08-21 LAB — FERRITIN: Ferritin: 25 ng/mL (ref 11–307)

## 2019-08-22 ENCOUNTER — Inpatient Hospital Stay (HOSPITAL_BASED_OUTPATIENT_CLINIC_OR_DEPARTMENT_OTHER): Payer: BC Managed Care – PPO | Admitting: Oncology

## 2019-08-22 ENCOUNTER — Encounter: Payer: Self-pay | Admitting: Oncology

## 2019-08-22 ENCOUNTER — Inpatient Hospital Stay: Payer: BC Managed Care – PPO

## 2019-08-22 VITALS — BP 111/78 | HR 98 | Temp 97.1°F | Wt 154.2 lb

## 2019-08-22 DIAGNOSIS — Z8249 Family history of ischemic heart disease and other diseases of the circulatory system: Secondary | ICD-10-CM | POA: Diagnosis not present

## 2019-08-22 DIAGNOSIS — N920 Excessive and frequent menstruation with regular cycle: Secondary | ICD-10-CM | POA: Diagnosis not present

## 2019-08-22 DIAGNOSIS — Z79899 Other long term (current) drug therapy: Secondary | ICD-10-CM | POA: Diagnosis not present

## 2019-08-22 DIAGNOSIS — D509 Iron deficiency anemia, unspecified: Secondary | ICD-10-CM

## 2019-08-22 DIAGNOSIS — I10 Essential (primary) hypertension: Secondary | ICD-10-CM | POA: Diagnosis not present

## 2019-08-22 DIAGNOSIS — Z8349 Family history of other endocrine, nutritional and metabolic diseases: Secondary | ICD-10-CM | POA: Diagnosis not present

## 2019-08-22 DIAGNOSIS — Z7289 Other problems related to lifestyle: Secondary | ICD-10-CM | POA: Diagnosis not present

## 2019-08-30 ENCOUNTER — Other Ambulatory Visit: Payer: Self-pay | Admitting: Family Medicine

## 2019-08-30 NOTE — Telephone Encounter (Signed)
Requested medication (s) are due for refill today: yes  Requested medication (s) are on the active medication list: yes  Last refill:  06/30/19 historical provider  Future visit scheduled:yes  Notes to clinic:  Not delegated historical provider    Requested Prescriptions  Pending Prescriptions Disp Refills   pregabalin (LYRICA) 50 MG capsule [Pharmacy Med Name: Pregabalin 50MG  CAPS] 30 capsule     Sig: TAKE 1-2 CAPSULES (50-100 MG TOTAL) BY MOUTH AT BEDTIME.      Not Delegated - Neurology:  Anticonvulsants - Controlled Failed - 08/30/2019 10:52 AM      Failed - This refill cannot be delegated      Passed - Valid encounter within last 12 months    Recent Outpatient Visits           2 weeks ago [facility] Steele Sizer, MD   4 months ago Essential hypertension   [facility] Steele Sizer, MD   10 months ago Essential hypertension   [facility] Steele Sizer, MD   1 year ago Well woman exam   [facility] Steele Sizer, MD   1 year ago Essential hypertension   [facility] Steele Sizer, MD       Future Appointments             In 3 weeks Steele Sizer, MD [facility], [place]   In 5 months Steele Sizer, MD [facility], [facility]

## 2019-09-07 ENCOUNTER — Ambulatory Visit: Payer: BC Managed Care – PPO | Admitting: Family Medicine

## 2019-09-07 ENCOUNTER — Other Ambulatory Visit: Payer: Self-pay

## 2019-09-07 ENCOUNTER — Encounter: Payer: Self-pay | Admitting: Family Medicine

## 2019-09-07 VITALS — BP 120/80 | HR 99 | Temp 97.3°F | Resp 16 | Ht 59.0 in | Wt 149.0 lb

## 2019-09-07 DIAGNOSIS — M25522 Pain in left elbow: Secondary | ICD-10-CM

## 2019-09-07 MED ORDER — MELOXICAM 15 MG PO TABS: 15.0000 mg | ORAL_TABLET | Freq: Every day | ORAL | 0 refills | Status: DC

## 2019-09-07 NOTE — Patient Instructions (Signed)
Golfer's Elbow  Golfer's elbow, also called medial epicondylitis, is a condition that results from inflammation of the strong bands of tissue (tendons) that attach your forearm muscles to the inside of your bone at the elbow. These tendons affect the muscles that bend the palm toward the wrist (flexion). The tendons become less flexible with age. This condition is called golfer's elbow because it is more common among people who constantly bend and twist their wrists, such as golfers. This injury is usually caused by overuse. What are the causes? This condition is caused by:  Repeatedly flexing, turning, or twisting your wrist.  Constantly gripping objects with your hands. What increases the risk? This condition is more likely to develop in people who play golf, baseball, or tennis. This injury is more common among people who have jobs that require the constant use of their hands, such as:  Carpenters.  Butchers.  Musicians.  Typists. What are the signs or symptoms? This condition causes elbow pain that may spread to your forearm and upper arm. Symptoms of this condition include.  Pain at the inner elbow, forearm, or wrist.  Reduced grip strength. The pain may get worse when you bend your wrist downward. How is this diagnosed? This condition is diagnosed based on your symptoms, medical history, and a physical exam. During the exam, your health care provider may:  Test your grip strength.  Move your wrist to check for pain. You may also have an MRI to:  Confirm the diagnosis.  Look for other issues.  Check for tears in the ligaments, muscles, or tendons. How is this treated? Treatment for this condition includes:  Stopping all activities that make you bend or twist your elbow or wrist and waiting until your pain and other symptoms go away before resuming those activities.  Wearing an elbow brace or wrist splint to restrict the movements that cause symptoms.  Icing your  inner elbow, forearm, or wrist to relieve pain.  Taking NSAIDs or getting corticosteroid injections to reduce pain and swelling.  Doing stretching, range-of-motion, and strengthening exercises (physical therapy) as told by your health care provider. In rare cases, surgery may be needed if your condition does not improve. Follow these instructions at home: If you have a brace or splint:  Wear it as told by your health care provider.  Loosen it if your fingers tingle, become numb, or turn cold and blue.  Keep it clean. Managing pain, stiffness, and swelling   If directed, put ice on the injured area. ? Put ice in a plastic bag. ? Place a towel between your skin and the bag. ? Leave the ice on for 20 minutes, 2-3 times a day.  Move your fingers often to avoid stiffness.  Raise (elevate) the injured area above the level of your heart while you are sitting or lying down. Activity  Rest your injured area as told by your health care provider.  Return to your normal activities as told by your health care provider. Ask your health care provider what activities are safe for you.  Do exercises as told by your health care provider. Lifestyle  If your condition is caused by sports, work with a trainer to make sure that you: ? Have the correct technique. ? Are using the proper equipment.  If your condition is work related, talk with your employer about changes that can be made. General instructions  Take over-the-counter and prescription medicines only as told by your health care provider.  Do not   use any products that contain nicotine or tobacco, such as cigarettes, e-cigarettes, and chewing tobacco. If you need help quitting, ask your health care provider.  Keep all follow-up visits as told by your health care provider. This is important. How is this prevented?  Before and after activity: ? Warm up and stretch before being active. ? Cool down and stretch after being  active. ? Give your body time to rest between periods of activity.  During activity: ? Make sure to use equipment that fits you. ? If you play golf, slow your golf swing to reduce shock in the arm when making contact with the ball.  Maintain physical fitness, including: ? Strength. ? Flexibility. ? Cardiovascular fitness. ? Endurance.  Do exercises to strengthen the forearm muscles. Contact a health care provider if:  Your pain does not improve or it gets worse.  You notice numbness in your hand. Get help right away if:  Your pain is severe.  You cannot move your wrist. Summary  Golfer's elbow, also called medial epicondylitis, is a condition that results from inflammation of the strong bands of tissue (tendons) that attach your forearm muscles to the inside of your bone at the elbow.  This injury usually results from overuse.  Symptoms of this condition include decreased grip strength and pain at the inner elbow, forearm, or wrist.  This injury is treated with rest, ice, medicines, physical therapy, and surgery as needed. This information is not intended to replace advice given to you by your health care provider. Make sure you discuss any questions you have with your health care provider. Document Revised: 09/15/2018 Document Reviewed: 03/31/2018 Elsevier Patient Education  [place] Ask your health care provider which exercises are safe for you. Do exercises exactly as told by your health care provider and adjust them as directed. It is normal to feel mild stretching, pulling, tightness, or discomfort as you do these exercises. Stop right away if you feel sudden pain or your pain gets worse. Do not begin these exercises until told by your health care provider. Stretching and range-of-motion exercises These exercises warm up your muscles and joints and improve the movement and flexibility of your elbow. Wrist extension  1. Straighten your  left / right elbow in front of you with your palm facing up toward the ceiling. ? If told by your health care provider, bend your left / right elbow to a 90-degree angle (right angle) at your side. 2. With your other hand, gently pull your left / right hand and fingers toward the floor (extension). Stop when you feel a gentle stretch on the palm side of your forearm. 3. Hold this position for __________ seconds. Repeat __________ times. Complete this exercise __________ times a day. Wrist flexion  1. Straighten your left / right elbow in front of you with your palm facing down toward the floor. ? If told by your health care provider, bend your left / right elbow to a 90-degree angle (right angle) at your side. 2. With your other hand, gently push over the back of your left / right hand so your fingers point toward the floor (flexion). Stop when you feel a gentle stretch on the back of your forearm. 3. Hold this position for __________ seconds. Repeat __________ times. Complete this exercise __________ times a day. Forearm rotation, supination 1. Sit or stand with your elbows at your side. 2. Bend your left / right elbow to a 90-degree angle (right angle).  3. Using your uninjured hand, turn your left / right palm up toward the ceiling (supination) until you feel a gentle stretch along the inside of your forearm. 4. Hold this position for __________ seconds. Repeat __________ times. Complete this exercise __________ times a day. Forearm rotation, pronation 1. Sit or stand with your elbows at your side. 2. Bend your left / right elbow to a 90-degree angle (right angle). 3. Using your uninjured hand, turn your left / right palm down toward the floor (pronation) until you feel a gentle stretch along the top of your forearm. 4. Hold this position for __________ seconds. Repeat __________ times. Complete this exercise __________ times a day. Strengthening exercises These exercises build strength  and endurance in your elbow. Endurance is the ability to use your muscles for a long time, even after they get tired. Wrist flexion  1. Sit with your left / right forearm supported on a table or other surface and your palm turned up toward the ceiling. Let your left / right wrist extend over the edge of the surface. 2. Hold a __________ weight or a piece of rubber exercise band or tubing. ? If using a rubber exercise band or tubing, hold the other end of the tubing with your other hand. 3. Slowly bend your wrist so your hand moves up toward the ceiling (flexion). Try to only move your wrist and keep the rest of your arm still. 4. Hold this position for __________ seconds. 5. Slowly return to the starting position. Repeat __________ times. Complete this exercise __________ times a day. Wrist flexion, eccentric 1. Sit with your left / right forearm palm-up and supported on a table or other surface. Let your left / right wrist extend over the edge of the surface. 2. Hold a __________ weight or a piece of rubber exercise band or tubing in your left / right hand. ? If using a rubber exercise band or tubing, hold the other end of the tubing with your other hand. 3. Use your uninjured hand to move your left / right hand up toward the ceiling. 4. Take your uninjured hand away and slowly return to the starting position using only your left / right hand (eccentric flexion). Repeat __________ times. Complete this exercise __________ times a day. Forearm rotation, pronation To do this exercise, you will need a lightweight hammer or rubber mallet. 1. Sit with your left / right forearm supported on a table or other surface. Bend your elbow to a 90-degree angle (right angle). Position your forearm so that your palm is facing up toward the ceiling, with your hand resting over the edge of the table. 2. Hold a hammer in your left / right hand. ? To make this exercise easier, hold the hammer near the head of the  hammer. ? To make this exercise harder, hold the hammer near the end of the handle. 3. Without moving your elbow, slowly turn (rotate) your forearm so your palm faces down toward the floor (pronation). 4. Hold this position for __________ seconds. 5. Slowly return to the starting position. Repeat __________ times. Complete this exercise __________ times a day. Shoulder blade squeeze 1. Sit in a stable chair or stand with good posture. If you are sitting down, do not let your back touch the back of the chair. 2. Your arms should be at your sides with your elbows bent to a 90-degree angle (right angle). Position your forearms so that your thumbs are facing the ceiling (neutral position). 3.  Without lifting your shoulders up, squeeze your shoulder blades tightly together. 4. Hold this position for __________ seconds. 5. Slowly release and return to the starting position. Repeat __________ times. Complete this exercise __________ times a day. This information is not intended to replace advice given to you by your health care provider. Make sure you discuss any questions you have with your health care provider. Document Revised: 09/15/2018 Document Reviewed: 07/19/2018 Elsevier Patient Education  [place].

## 2019-09-07 NOTE — Progress Notes (Signed)
Name: [name] [name]   MRN: [id number]    DOB: [birth year]   Date:09/07/2019       Progress Note  Subjective  Chief Complaint  Chief Complaint  Patient presents with  . Elbow Pain    Left Elbow is tender and sore x 4 days.    HPI  Left Elbow pain: she states that about 4 days ago noticed pain on left elbow, it got very intense a few days ago with swelling, inability to extend her elbow but no significant pain to touch, redness or increase in warmth. She denies any trauma but she works typing all day. Pain is improving and also the swelling. She is not allergic to nsaid's , no history of gout  Patient Active Problem List   Diagnosis Date Noted  . Special screening for malignant neoplasms, colon   . Rectal polyp   . Diverticulosis of large intestine without diverticulitis   . Hiatal hernia   . Iron deficiency anemia   . Pre-diabetes 10/13/2017  . Intermittent low back pain 10/13/2017  . History of gestational diabetes 10/13/2017  . Hyperlipidemia 05/08/2016  . Essential hypertension 12/20/2014  . Obesity 12/20/2014  . Gastro-esophageal reflux disease without esophagitis 01/25/2010    Past Surgical History:  Procedure Laterality Date  . CESAREAN SECTION    . COLONOSCOPY WITH PROPOFOL N/A 07/08/2018   Procedure: COLONOSCOPY WITH PROPOFOL;  Surgeon: Virgel Manifold, MD;  Location: [facility] ENDOSCOPY;  Service: Endoscopy;  Laterality: N/A;  . ESOPHAGOGASTRODUODENOSCOPY (EGD) WITH PROPOFOL N/A 07/08/2018   Procedure: ESOPHAGOGASTRODUODENOSCOPY (EGD) WITH PROPOFOL;  Surgeon: Virgel Manifold, MD;  Location: [facility] ENDOSCOPY;  Service: Endoscopy;  Laterality: N/A;  . TEAR DUCT PROBING      Family History  Problem Relation Age of Onset  . Hypertension Mother   . Hyperlipidemia Mother   . Hypertension Father     Social History   Tobacco Use  . Smoking status: Never Smoker  . Smokeless tobacco: Never Used  Substance Use Topics  . Alcohol use: Yes    Alcohol/week: 1.0  standard drinks    Types: 1 Glasses of wine per week    Comment: seldom  every 4-5 months     Current Outpatient Medications:  .  ferrous sulfate 325 (65 FE) MG EC tablet, Take 1 tablet (325 mg total) by mouth 2 (two) times daily., Disp: 180 tablet, Rfl: 1 .  lisinopril-hydrochlorothiazide (ZESTORETIC) 10-12.5 MG tablet, Take 1 tablet by mouth daily., Disp: 90 tablet, Rfl: 1 .  naproxen (NAPROSYN) 500 MG tablet, Take 1 tablet (500 mg total) by mouth 2 (two) times daily with a meal. (Patient taking differently: Take 500 mg by mouth as needed. ), Disp: 30 tablet, Rfl: 0 .  Omega-3 Fatty Acids (FISH OIL) 645 MG CAPS, Take by mouth., Disp: , Rfl:  .  omeprazole (PRILOSEC) 40 MG capsule, Take 1 capsule (40 mg total) by mouth daily., Disp: 30 capsule, Rfl: 1 .  pregabalin (LYRICA) 50 MG capsule, TAKE 1-2 CAPSULES (50-100 MG TOTAL) BY MOUTH AT BEDTIME., Disp: 30 capsule, Rfl: 0 .  STOOL SOFTENER 100 MG capsule, TAKE 1 CAPSULE BY MOUTH TWICE A DAY WITH IRON PILLS FOR CONSTIPATION, Disp: 60 capsule, Rfl: 2 .  Vitamin D, Ergocalciferol, (DRISDOL) 1.25 MG (50000 UNIT) CAPS capsule, Take 1 capsule (50,000 Units total) by mouth every 7 (seven) days., Disp: 12 capsule, Rfl: 1  Allergies  Allergen Reactions  . Zantac [Ranitidine Hcl]     I personally reviewed active problem list,  medication list, allergies, family history, social history, health maintenance with the patient/caregiver today.   ROS  Ten systems reviewed and is negative except as mentioned in HPI   Objective  Vitals:   09/07/19 1126  BP: 120/80  Pulse: 99  Resp: 16  Temp: (!) 97.3 F (36.3 C)  TempSrc: Temporal  SpO2: 99%  Weight: 149 lb (67.6 kg)  Height: 4\' 11"  (1.499 m)    Body mass index is 30.09 kg/m.  Physical Exam  Constitutional: Patient appears well-developed and well-nourished. Obese  No distress.  HEENT: head atraumatic, normocephalic, pupils equal and reactive to light Cardiovascular: Normal rate, regular  rhythm and normal heart sounds.  No murmur heard. No BLE edema. Pulmonary/Chest: Effort normal and breath sounds normal. No respiratory distress. Abdominal: Soft.  There is no tenderness. Muscular Skeletal: difficulty stretching left arm, pain and swelling on left medial epicondyle. No redness or increase in warmth . Pain only with pressure not with light touch  Psychiatric: Patient has a normal mood and affect. behavior is normal. Judgment and thought content normal.  Recent Results (from the past 2160 hour(s))  Novel Coronavirus, NAA (Hosp order, Send-out to Ref Lab; TAT 18-24 hrs     Status: Abnormal   Collection Time: 07/01/19  8:39 AM   Specimen: Nasopharyngeal Swab; Respiratory  Result Value Ref Range   SARS-CoV-2, NAA DETECTED (A) NOT DETECTED    Comment: RESULT CALLED TO, READ BACK BY AND VERIFIED WITH: NOTIFIED MELISSA HADLEY 07/04/2019 1107 ACR (NOTE)                  Client Requested Flag This nucleic acid amplification test was developed and its performance characteristics determined by [employer]. Nucleic acid amplification tests include RT-PCR and TMA. This test has not been FDA cleared or approved. This test has been authorized by FDA under an Emergency Use Authorization (EUA). This test is only authorized for the duration of time the declaration that circumstances exist justifying the authorization of the emergency use of in vitro diagnostic tests for detection of SARS-CoV-2 virus and/or diagnosis of COVID-19 infection under section 564(b)(1) of the Act, 21 U.S.C. [id number]) (1), unless the authorization is terminated or revoked sooner. When diagnostic testing is negative, the possibility of a false negative result should be considered in the context of a patient's recent exposures and the presence o f clinical signs and symptoms consistent with COVID-19. An individual without symptoms of COVID- 19 and who is not shedding SARS-CoV-2 virus would expect to have  a negative (not detected) result in this assay. Performed At: [facility] [place], [place] [id number] Rush Farmer MD [id number] Performed at [facility], [address] [place], [place] [address]    Coronavirus Source NASOPHARYNGEAL     Comment: Performed at [facility], [address], [place], [place] [address]  Lipid panel     Status: Abnormal   Collection Time: 08/11/19 12:27 PM  Result Value Ref Range   Cholesterol 233 (H) <200 mg/dL   HDL 49 (L) > OR = 50 mg/dL   Triglycerides 121 <150 mg/dL   LDL Cholesterol (Calc) 160 (H) mg/dL (calc)    Comment: Reference range: <100 . Desirable range <100 mg/dL for primary prevention;   <70 mg/dL for patients with CHD or diabetic patients  with > or = 2 CHD risk factors. Marland Kitchen LDL-C is now calculated using the Martin-Hopkins  calculation, which is a validated novel method providing  better accuracy than  the Friedewald equation in the  estimation of LDL-C.  Cresenciano Genre et al. Annamaria Helling. [id number]): 2061-2068  (http://education.[employer].com/faq/FAQ164)    Total CHOL/HDL Ratio 4.8 <5.0 (calc)   Non-HDL Cholesterol (Calc) 184 (H) <130 mg/dL (calc)    Comment: For patients with diabetes plus 1 major ASCVD risk  factor, treating to a non-HDL-C goal of <100 mg/dL  (LDL-C of <70 mg/dL) is considered a therapeutic  option.   COMPLETE METABOLIC PANEL WITH GFR     Status: None   Collection Time: 08/11/19 12:27 PM  Result Value Ref Range   Glucose, Bld 85 65 - 99 mg/dL    Comment: .            Fasting reference interval .    BUN 14 7 - 25 mg/dL   Creat 0.87 0.50 - 1.05 mg/dL    Comment: For patients >44 years of age, the reference limit for Creatinine is approximately 13% higher for people identified as African-American. .    GFR, Est Non African American 78 > OR = 60 mL/min/1.74m2   GFR, Est African American 90 > OR = 60 mL/min/1.43m2   BUN/Creatinine Ratio NOT APPLICABLE 6 -  22 (calc)   Sodium 140 135 - 146 mmol/L   Potassium 4.1 3.5 - 5.3 mmol/L   Chloride 104 98 - 110 mmol/L   CO2 26 20 - 32 mmol/L   Calcium 10.2 8.6 - 10.4 mg/dL   Total Protein 7.5 6.1 - 8.1 g/dL   Albumin 4.6 3.6 - 5.1 g/dL   Globulin 2.9 1.9 - 3.7 g/dL (calc)   AG Ratio 1.6 1.0 - 2.5 (calc)   Total Bilirubin 0.3 0.2 - 1.2 mg/dL   Alkaline phosphatase (APISO) 56 37 - 153 U/L   AST 17 10 - 35 U/L   ALT 12 6 - 29 U/L  Hemoglobin A1c     Status: Abnormal   Collection Time: 08/11/19 12:27 PM  Result Value Ref Range   Hgb A1c MFr Bld 6.1 (H) <5.7 % of total Hgb    Comment: For someone without known diabetes, a hemoglobin  A1c value between 5.7% and 6.4% is consistent with prediabetes and should be confirmed with a  follow-up test. . For someone with known diabetes, a value <7% indicates that their diabetes is well controlled. A1c targets should be individualized based on duration of diabetes, age, comorbid conditions, and other considerations. . This assay result is consistent with an increased risk of diabetes. . Currently, no consensus exists regarding use of hemoglobin A1c for diagnosis of diabetes for children. .    Mean Plasma Glucose 128 (calc)   eAG (mmol/L) 7.1 (calc)  H. pylori breath test     Status: None   Collection Time: 08/11/19 12:27 PM  Result Value Ref Range   H. pylori Breath Test NOT DETECTED NOT DETECT    Comment: . Antimicrobials, proton pump inhibitors, and bismuth preparations are known to suppress H. pylori, and  ingestion of these prior to H. pylori diagnostic testing may lead to false negative results. If clinically  indicated, the test may be repeated on a new specimen obtained two weeks after discontinuing treatment. However, a positive result is still clinically valid.   Iron and TIBC     Status: None   Collection Time: 08/21/19  3:03 PM  Result Value Ref Range   Iron 65 28 - 170 ug/dL   TIBC 392 250 - 450 ug/dL   Saturation Ratios 17  10.4 - 31.8 %  UIBC 327 ug/dL    Comment: Performed at [facility], [address] [place], [place] [address]  Ferritin     Status: None   Collection Time: 08/21/19  3:03 PM  Result Value Ref Range   Ferritin 25 11 - 307 ng/mL    Comment: Performed at [facility], [address] [place], [place] [address]  CBC with Differential     Status: Abnormal   Collection Time: 08/21/19  3:03 PM  Result Value Ref Range   WBC 8.4 4.0 - 10.5 K/uL   RBC 4.05 3.87 - 5.11 MIL/uL   Hemoglobin 11.5 (L) 12.0 - 15.0 g/dL   HCT 34.9 (L) 36.0 - 46.0 %   MCV 86.2 80.0 - 100.0 fL   MCH 28.4 26.0 - 34.0 pg   MCHC 33.0 30.0 - 36.0 g/dL   RDW 14.7 11.5 - 15.5 %   Platelets 372 150 - 400 K/uL   nRBC 0.0 0.0 - 0.2 %   Neutrophils Relative % 71 %   Neutro Abs 6.0 1.7 - 7.7 K/uL   Lymphocytes Relative 18 %   Lymphs Abs 1.5 0.7 - 4.0 K/uL   Monocytes Relative 8 %   Monocytes Absolute 0.7 0.1 - 1.0 K/uL   Eosinophils Relative 2 %   Eosinophils Absolute 0.2 0.0 - 0.5 K/uL   Basophils Relative 1 %   Basophils Absolute 0.0 0.0 - 0.1 K/uL   Immature Granulocytes 0 %   Abs Immature Granulocytes 0.02 0.00 - 0.07 K/uL    Comment: Performed at [facility], [address] [place], [place] [address]     PHQ2/9: Depression screen [facility] 2/9 09/07/2019 08/11/2019 04/10/2019 10/14/2018 04/15/2018  Decreased Interest 0 0 0 0 0  Down, Depressed, Hopeless 0 0 0 0 0  PHQ - 2 Score 0 0 0 0 0  Altered sleeping 0 0 0 0 0  Tired, decreased energy 0 0 0 0 0  Change in appetite 0 0 0 0 0  Feeling bad or failure about yourself  0 0 0 0 0  Trouble concentrating 0 0 0 0 0  Moving slowly or fidgety/restless 0 0 0 0 0  Suicidal thoughts 0 0 0 0 0  PHQ-9 Score 0 0 0 0 0  Difficult doing work/chores - Not difficult at all - Not difficult at all Not difficult at all    phq 9 is negative   Fall Risk: Fall Risk  09/07/2019 08/11/2019 04/10/2019 10/14/2018 08/10/2018  Falls in the past year? 0 0 0 0 0  Number  falls in past yr: 0 0 0 0 0  Injury with Fall? 0 0 0 0 0     Functional Status Survey: Is the patient deaf or have difficulty hearing?: No Does the patient have difficulty seeing, even when wearing glasses/contacts?: No Does the patient have difficulty concentrating, remembering, or making decisions?: No Does the patient have difficulty walking or climbing stairs?: No Does the patient have difficulty dressing or bathing?: Yes Does the patient have difficulty doing errands alone such as visiting a doctor's office or shopping?: No  Assessment & Plan  1. Left elbow pain  - meloxicam (MOBIC) 15 MG tablet; Take 1 tablet (15 mg total) by mouth daily.  Dispense: 30 tablet; Refill: 0.  Apply ice, rest

## 2019-09-20 ENCOUNTER — Encounter: Payer: Self-pay | Admitting: Family Medicine

## 2019-09-20 ENCOUNTER — Other Ambulatory Visit: Payer: Self-pay

## 2019-09-20 ENCOUNTER — Ambulatory Visit (INDEPENDENT_AMBULATORY_CARE_PROVIDER_SITE_OTHER): Payer: BC Managed Care – PPO | Admitting: Family Medicine

## 2019-09-20 VITALS — BP 122/78 | HR 97 | Temp 97.1°F | Resp 16 | Ht 59.0 in | Wt 146.1 lb

## 2019-09-20 DIAGNOSIS — Z23 Encounter for immunization: Secondary | ICD-10-CM | POA: Diagnosis not present

## 2019-09-20 DIAGNOSIS — Z0000 Encounter for general adult medical examination without abnormal findings: Secondary | ICD-10-CM | POA: Diagnosis not present

## 2019-09-20 DIAGNOSIS — Z1231 Encounter for screening mammogram for malignant neoplasm of breast: Secondary | ICD-10-CM | POA: Diagnosis not present

## 2019-09-20 NOTE — Progress Notes (Signed)
Name: [name] [name]   MRN: [id number]    DOB: 1970/09/06   Date:09/20/2019       Progress Note  Subjective  Chief Complaint  Chief Complaint  Patient presents with  . Annual Exam    HPI  Patient presents for annual CPE.  Diet: she has been cutting down on portion and has lost weight.  Exercise: not currently, she has a treadmill but not using it  USPSTF grade A and B recommendations    Office Visit from 09/20/2019 in [facility]  AUDIT-C Score  0     Depression: Phq 9 is  negative Depression screen [facility] 2/9 09/20/2019 09/07/2019 08/11/2019 04/10/2019 10/14/2018  Decreased Interest 0 0 0 0 0  Down, Depressed, Hopeless 0 0 0 0 0  PHQ - 2 Score 0 0 0 0 0  Altered sleeping 0 0 0 0 0  Tired, decreased energy 0 0 0 0 0  Change in appetite 0 0 0 0 0  Feeling bad or failure about yourself  0 0 0 0 0  Trouble concentrating 0 0 0 0 0  Moving slowly or fidgety/restless 0 0 0 0 0  Suicidal thoughts 0 0 0 0 0  PHQ-9 Score 0 0 0 0 0  Difficult doing work/chores - - Not difficult at all - Not difficult at all   Hypertension: BP Readings from Last 3 Encounters:  09/20/19 122/78  09/07/19 120/80  08/22/19 111/78   Obesity: Wt Readings from Last 3 Encounters:  09/20/19 146 lb 1.6 oz (66.3 kg)  09/07/19 149 lb (67.6 kg)  08/22/19 154 lb 3.2 oz (69.9 kg)   BMI Readings from Last 3 Encounters:  09/20/19 29.51 kg/m  09/07/19 30.09 kg/m  08/22/19 31.14 kg/m     Hep C Screening: 08/2018  STD testing and prevention (HIV/chl/gon/syphilis): negative screen  Intimate partner violence: negative screen  Sexual History (Partners/Practices/Protection from [employer] hx STI/Pregnancy Plans): no pain, libido waxes and wanes  Pain during Intercourse: no pain  Menstrual History/LMP/Abnormal Bleeding: started skipping cycles last year, this year she skipped January and February, discussed peri-menopause, she has noticed mood changes  Incontinence Symptoms:   Breast cancer:   - Last Mammogram: she needs to schedule it  - BRCA gene screening: N/A  Osteoporosis: Discussed high calcium and vitamin D supplementation, weight bearing exercises  Cervical cancer screening: up to date  Skin cancer: Discussed monitoring for atypical lesions  Colorectal cancer: repeat in 2025 Lung cancer:  Low Dose CT Chest recommended if Age 95-80 years, 30 pack-year currently smoking OR have quit w/in 15years. Patient does not qualify.   ECG: 10/2017   Advanced Care Planning: A voluntary discussion about advance care planning including the explanation and discussion of advance directives.  Discussed health care proxy and Living will, and the patient was able to identify a health care proxy as husband.  Patient does not have a living will at present time. If patient does have living will, I have requested they bring this to the clinic to be scanned in to their chart.  Lipids: Lab Results  Component Value Date   CHOL 233 (H) 08/11/2019   CHOL 220 (H) 08/10/2018   CHOL 205 (H) 04/15/2018   Lab Results  Component Value Date   HDL 49 (L) 08/11/2019   HDL 57 08/10/2018   HDL 50 (L) 04/15/2018   Lab Results  Component Value Date   LDLCALC 160 (H) 08/11/2019   LDLCALC 137 (H) 08/10/2018  LDLCALC 135 (H) 04/15/2018   Lab Results  Component Value Date   TRIG 121 08/11/2019   TRIG 131 08/10/2018   TRIG 96 04/15/2018   Lab Results  Component Value Date   CHOLHDL 4.8 08/11/2019   CHOLHDL 3.9 08/10/2018   CHOLHDL 4.1 04/15/2018   No results found for: LDLDIRECT  Glucose: Glucose, Bld  Date Value Ref Range Status  08/11/2019 85 65 - 99 mg/dL Final    Comment:    .            Fasting reference interval .   08/10/2018 74 65 - 99 mg/dL Final    Comment:    .            Fasting reference interval .   04/15/2018 82 65 - 99 mg/dL Final    Comment:    .            Fasting reference interval .     Patient Active Problem List   Diagnosis Date Noted  . Special  screening for malignant neoplasms, colon   . Rectal polyp   . Diverticulosis of large intestine without diverticulitis   . Hiatal hernia   . Iron deficiency anemia   . Pre-diabetes 10/13/2017  . Intermittent low back pain 10/13/2017  . History of gestational diabetes 10/13/2017  . Hyperlipidemia 05/08/2016  . Essential hypertension 12/20/2014  . Gastro-esophageal reflux disease without esophagitis 01/25/2010    Past Surgical History:  Procedure Laterality Date  . CESAREAN SECTION    . COLONOSCOPY WITH PROPOFOL N/A 07/08/2018   Procedure: COLONOSCOPY WITH PROPOFOL;  Surgeon: Virgel Manifold, MD;  Location: [facility] ENDOSCOPY;  Service: Endoscopy;  Laterality: N/A;  . ESOPHAGOGASTRODUODENOSCOPY (EGD) WITH PROPOFOL N/A 07/08/2018   Procedure: ESOPHAGOGASTRODUODENOSCOPY (EGD) WITH PROPOFOL;  Surgeon: Virgel Manifold, MD;  Location: [facility] ENDOSCOPY;  Service: Endoscopy;  Laterality: N/A;  . TEAR DUCT PROBING      Family History  Problem Relation Age of Onset  . Hypertension Mother   . Hyperlipidemia Mother   . Hypertension Father     Social History   Socioeconomic History  . Marital status: Married    Spouse name: Audry Pili   . Number of children: 2  . Years of education: Not on file  . Highest education level: Master's degree (e.g., MA, MS, MEng, MEd, MSW, MBA)  Occupational History  . Occupation: Patent attorney    Comment: therapist and supervisor   Tobacco Use  . Smoking status: Never Smoker  . Smokeless tobacco: Never Used  Substance and Sexual Activity  . Alcohol use: Yes    Alcohol/week: 1.0 standard drinks    Types: 1 Glasses of wine per week    Comment: seldom  every 4-5 months  . Drug use: No  . Sexual activity: Yes    Partners: Male    Birth control/protection: Surgical  Other Topics Concern  . Not on file  Social History Narrative   Two boys, 14 years apart    Social Determinants of Health   Financial Resource Strain: Low Risk   .  Difficulty of Paying Living Expenses: Not hard at all  Food Insecurity: No Food Insecurity  . Worried About Charity fundraiser in the Last Year: Never true  . Ran Out of Food in the Last Year: Never true  Transportation Needs: No Transportation Needs  . Lack of Transportation (Medical): No  . Lack of Transportation (Non-Medical): No  Physical Activity: Inactive  . Days of  Exercise per Week: 0 days  . Minutes of Exercise per Session: 0 min  Stress: No Stress Concern Present  . Feeling of Stress : Not at all  Social Connections: Not Isolated  . Frequency of Communication with Friends and Family: More than three times a week  . Frequency of Social Gatherings with Friends and Family: More than three times a week  . Attends Religious Services: More than 4 times per year  . Active Member of Clubs or Organizations: Yes  . Attends Archivist Meetings: More than 4 times per year  . Marital Status: Married  Human resources officer Violence: Not At Risk  . Fear of Current or Ex-Partner: No  . Emotionally Abused: No  . Physically Abused: No  . Sexually Abused: No     Current Outpatient Medications:  .  ferrous sulfate 325 (65 FE) MG EC tablet, Take 1 tablet (325 mg total) by mouth 2 (two) times daily., Disp: 180 tablet, Rfl: 1 .  lisinopril-hydrochlorothiazide (ZESTORETIC) 10-12.5 MG tablet, Take 1 tablet by mouth daily., Disp: 90 tablet, Rfl: 1 .  naproxen (NAPROSYN) 500 MG tablet, Take 1 tablet (500 mg total) by mouth 2 (two) times daily with a meal. (Patient taking differently: Take 500 mg by mouth as needed. ), Disp: 30 tablet, Rfl: 0 .  Omega-3 Fatty Acids (FISH OIL) 645 MG CAPS, Take by mouth., Disp: , Rfl:  .  omeprazole (PRILOSEC) 40 MG capsule, Take 1 capsule (40 mg total) by mouth daily., Disp: 30 capsule, Rfl: 1 .  pregabalin (LYRICA) 50 MG capsule, TAKE 1-2 CAPSULES (50-100 MG TOTAL) BY MOUTH AT BEDTIME., Disp: 30 capsule, Rfl: 0 .  STOOL SOFTENER 100 MG capsule, TAKE 1 CAPSULE  BY MOUTH TWICE A DAY WITH IRON PILLS FOR CONSTIPATION, Disp: 60 capsule, Rfl: 2 .  Vitamin D, Ergocalciferol, (DRISDOL) 1.25 MG (50000 UNIT) CAPS capsule, Take 1 capsule (50,000 Units total) by mouth every 7 (seven) days., Disp: 12 capsule, Rfl: 1  Allergies  Allergen Reactions  . Zantac [Ranitidine Hcl]      ROS  Constitutional: Negative for fever or weight change.  Respiratory: Negative for cough and shortness of breath.   Cardiovascular: Negative for chest pain or palpitations.  Gastrointestinal: Negative for abdominal pain, no bowel changes.  Musculoskeletal: Negative for gait problem or joint swelling.  Skin: Negative for rash.  Neurological: Negative for dizziness or headache.  No other specific complaints in a complete review of systems (except as listed in HPI above).  Objective  Vitals:   09/20/19 0819  BP: 122/78  Pulse: 97  Resp: 16  Temp: (!) 97.1 F (36.2 C)  TempSrc: Temporal  SpO2: 97%  Weight: 146 lb 1.6 oz (66.3 kg)  Height: 4' 11"  (1.499 m)    Body mass index is 29.51 kg/m.  Physical Exam  Constitutional: Patient appears well-developed and well-nourished. No distress.  HENT: Head: Normocephalic and atraumatic. Ears: B TMs ok, no erythema or effusion; Nose: Nose normal. Mouth/Throat: Oropharynx is clear and moist. No oropharyngeal exudate.  Eyes: Conjunctivae and EOM are normal. Pupils are equal, round, and reactive to light. No scleral icterus.  Neck: Normal range of motion. Neck supple. No JVD present. No thyromegaly present.  Cardiovascular: Normal rate, regular rhythm and normal heart sounds.  No murmur heard. No BLE edema. Pulmonary/Chest: Effort normal and breath sounds normal. No respiratory distress. Abdominal: Soft. Bowel sounds are normal, no distension. There is no tenderness. no masses Breast: no lumps or masses, no nipple discharge  or rashes FEMALE GENITALIA:  Not done RECTAL: not done Musculoskeletal: Normal range of motion, no joint  effusions. No gross deformities Neurological: he is alert and oriented to person, place, and time. No cranial nerve deficit. Coordination, balance, strength, speech and gait are normal.  Skin: Skin is warm and dry. No rash noted. No erythema.  Psychiatric: Patient has a normal mood and affect. behavior is normal. Judgment and thought content normal.  Recent Results (from the past 2160 hour(s))  Novel Coronavirus, NAA (Hosp order, Send-out to Ref Lab; TAT 18-24 hrs     Status: Abnormal   Collection Time: 07/01/19  8:39 AM   Specimen: Nasopharyngeal Swab; Respiratory  Result Value Ref Range   SARS-CoV-2, NAA DETECTED (A) NOT DETECTED    Comment: RESULT CALLED TO, READ BACK BY AND VERIFIED WITH: NOTIFIED MELISSA HADLEY 07/04/2019 1107 ACR (NOTE)                  Client Requested Flag This nucleic acid amplification test was developed and its performance characteristics determined by [employer]. Nucleic acid amplification tests include RT-PCR and TMA. This test has not been FDA cleared or approved. This test has been authorized by FDA under an Emergency Use Authorization (EUA). This test is only authorized for the duration of time the declaration that circumstances exist justifying the authorization of the emergency use of in vitro diagnostic tests for detection of SARS-CoV-2 virus and/or diagnosis of COVID-19 infection under section 564(b)(1) of the Act, 21 U.S.C. 025ENI-7(P) (1), unless the authorization is terminated or revoked sooner. When diagnostic testing is negative, the possibility of a false negative result should be considered in the context of a patient's recent exposures and the presence o f clinical signs and symptoms consistent with COVID-19. An individual without symptoms of COVID- 19 and who is not shedding SARS-CoV-2 virus would expect to have a negative (not detected) result in this assay. Performed At: [facility] [place], [place]  [id number] Rush Farmer MD [id number] Performed at [facility], [address] [place], [place] [address]    Coronavirus Source NASOPHARYNGEAL     Comment: Performed at [facility], [address], [place], [place] [address]  Lipid panel     Status: Abnormal   Collection Time: 08/11/19 12:27 PM  Result Value Ref Range   Cholesterol 233 (H) <200 mg/dL   HDL 49 (L) > OR = 50 mg/dL   Triglycerides 121 <150 mg/dL   LDL Cholesterol (Calc) 160 (H) mg/dL (calc)    Comment: Reference range: <100 . Desirable range <100 mg/dL for primary prevention;   <70 mg/dL for patients with CHD or diabetic patients  with > or = 2 CHD risk factors. Marland Kitchen LDL-C is now calculated using the Martin-Hopkins  calculation, which is a validated novel method providing  better accuracy than the Friedewald equation in the  estimation of LDL-C.  Cresenciano Genre et al. Annamaria Helling. 2458;099(83): 2061-2068  (http://education.[employer].com/faq/FAQ164)    Total CHOL/HDL Ratio 4.8 <5.0 (calc)   Non-HDL Cholesterol (Calc) 184 (H) <130 mg/dL (calc)    Comment: For patients with diabetes plus 1 major ASCVD risk  factor, treating to a non-HDL-C goal of <100 mg/dL  (LDL-C of <70 mg/dL) is considered a therapeutic  option.   COMPLETE METABOLIC PANEL WITH GFR     Status: None   Collection Time: 08/11/19 12:27 PM  Result Value Ref Range   Glucose, Bld 85 65 - 99 mg/dL    Comment: .  Fasting reference interval .    BUN 14 7 - 25 mg/dL   Creat 0.87 0.50 - 1.05 mg/dL    Comment: For patients >53 years of age, the reference limit for Creatinine is approximately 13% higher for people identified as African-American. .    GFR, Est Non African American 78 > OR = 60 mL/min/1.81m   GFR, Est African American 90 > OR = 60 mL/min/1.745m  BUN/Creatinine Ratio NOT APPLICABLE 6 - 22 (calc)   Sodium 140 135 - 146 mmol/L   Potassium 4.1 3.5 - 5.3 mmol/L   Chloride 104 98 - 110 mmol/L   CO2 26  20 - 32 mmol/L   Calcium 10.2 8.6 - 10.4 mg/dL   Total Protein 7.5 6.1 - 8.1 g/dL   Albumin 4.6 3.6 - 5.1 g/dL   Globulin 2.9 1.9 - 3.7 g/dL (calc)   AG Ratio 1.6 1.0 - 2.5 (calc)   Total Bilirubin 0.3 0.2 - 1.2 mg/dL   Alkaline phosphatase (APISO) 56 37 - 153 U/L   AST 17 10 - 35 U/L   ALT 12 6 - 29 U/L  Hemoglobin A1c     Status: Abnormal   Collection Time: 08/11/19 12:27 PM  Result Value Ref Range   Hgb A1c MFr Bld 6.1 (H) <5.7 % of total Hgb    Comment: For someone without known diabetes, a hemoglobin  A1c value between 5.7% and 6.4% is consistent with prediabetes and should be confirmed with a  follow-up test. . For someone with known diabetes, a value <7% indicates that their diabetes is well controlled. A1c targets should be individualized based on duration of diabetes, age, comorbid conditions, and other considerations. . This assay result is consistent with an increased risk of diabetes. . Currently, no consensus exists regarding use of hemoglobin A1c for diagnosis of diabetes for children. .    Mean Plasma Glucose 128 (calc)   eAG (mmol/L) 7.1 (calc)  H. pylori breath test     Status: None   Collection Time: 08/11/19 12:27 PM  Result Value Ref Range   H. pylori Breath Test NOT DETECTED NOT DETECT    Comment: . Antimicrobials, proton pump inhibitors, and bismuth preparations are known to suppress H. pylori, and  ingestion of these prior to H. pylori diagnostic testing may lead to false negative results. If clinically  indicated, the test may be repeated on a new specimen obtained two weeks after discontinuing treatment. However, a positive result is still clinically valid.   Iron and TIBC     Status: None   Collection Time: 08/21/19  3:03 PM  Result Value Ref Range   Iron 65 28 - 170 ug/dL   TIBC 392 250 - 450 ug/dL   Saturation Ratios 17 10.4 - 31.8 %   UIBC 327 ug/dL    Comment: Performed at [facility] [place]  [id number]     Status: None   Collection Time: 08/21/19  3:03 PM  Result Value Ref Range   Ferritin 25 11 - 307 ng/mL    Comment: Performed at [facility] [place] [id number] with Differential     Status: Abnormal   Collection Time: 08/21/19  3:03 PM  Result Value Ref Range   WBC 8.4 4.0 - 10.5 K/uL   RBC 4.05 3.87 - 5.11 MIL/uL   Hemoglobin 11.5 (L) 12.0 - 15.0 g/dL   HCT 34.9 (L) 36.0 - 46.0 %  MCV 86.2 80.0 - 100.0 fL   MCH 28.4 26.0 - 34.0 pg   MCHC 33.0 30.0 - 36.0 g/dL   RDW 14.7 11.5 - 15.5 %   Platelets 372 150 - 400 K/uL   nRBC 0.0 0.0 - 0.2 %   Neutrophils Relative % 71 %   Neutro Abs 6.0 1.7 - 7.7 K/uL   Lymphocytes Relative 18 %   Lymphs Abs 1.5 0.7 - 4.0 K/uL   Monocytes Relative 8 %   Monocytes Absolute 0.7 0.1 - 1.0 K/uL   Eosinophils Relative 2 %   Eosinophils Absolute 0.2 0.0 - 0.5 K/uL   Basophils Relative 1 %   Basophils Absolute 0.0 0.0 - 0.1 K/uL   Immature Granulocytes 0 %   Abs Immature Granulocytes 0.02 0.00 - 0.07 K/uL    Comment: Performed at [facility], [address] [place], [place] [address]      Fall Risk: Fall Risk  09/07/2019 08/11/2019 04/10/2019 10/14/2018 08/10/2018  Falls in the past year? 0 0 0 0 0  Number falls in past yr: 0 0 0 0 0  Injury with Fall? 0 0 0 0 0     Functional Status Survey: Is the patient deaf or have difficulty hearing?: No Does the patient have difficulty seeing, even when wearing glasses/contacts?: No Does the patient have difficulty concentrating, remembering, or making decisions?: No Does the patient have difficulty walking or climbing stairs?: No Does the patient have difficulty dressing or bathing?: No Does the patient have difficulty doing errands alone such as visiting a doctor's office or shopping?: No   Assessment & Plan  1. Well adult exam  She will increase physical activity , she needs 150 min per week   2. Need for shingles vaccine  She  refused  3. Breast cancer screening by mammogram  - MM 3D SCREEN BREAST BILATERAL; Future  -USPSTF grade A and B recommendations reviewed with patient; age-appropriate recommendations, preventive care, screening tests, etc discussed and encouraged; healthy living encouraged; see AVS for patient education given to patient -Discussed importance of 150 minutes of physical activity weekly, eat two servings of fish weekly, eat one serving of tree nuts ( cashews, pistachios, pecans, almonds.Marland Kitchen) every other day, eat 6 servings of fruit/vegetables daily and drink plenty of water and avoid sweet beverages.

## 2019-09-20 NOTE — Patient Instructions (Signed)

## 2019-09-28 ENCOUNTER — Other Ambulatory Visit: Payer: Self-pay | Admitting: Family Medicine

## 2019-10-02 ENCOUNTER — Ambulatory Visit
Admission: RE | Admit: 2019-10-02 | Discharge: 2019-10-02 | Disposition: A | Discharge status: Home / Self Care | Payer: BC Managed Care – PPO | Source: Ambulatory Visit | Attending: Family Medicine | Admitting: Family Medicine

## 2019-10-02 DIAGNOSIS — Z1231 Encounter for screening mammogram for malignant neoplasm of breast: Secondary | ICD-10-CM | POA: Diagnosis not present

## 2019-10-04 ENCOUNTER — Other Ambulatory Visit: Payer: Self-pay | Admitting: Family Medicine

## 2019-10-04 DIAGNOSIS — M25522 Pain in left elbow: Secondary | ICD-10-CM

## 2019-10-09 ENCOUNTER — Ambulatory Visit: Payer: BC Managed Care – PPO | Admitting: Family Medicine

## 2019-10-09 ENCOUNTER — Other Ambulatory Visit: Payer: Self-pay | Admitting: Family Medicine

## 2019-11-28 ENCOUNTER — Other Ambulatory Visit: Payer: Self-pay | Admitting: Family Medicine

## 2019-11-28 NOTE — Telephone Encounter (Signed)
Requested medication (s) are due for refill today: yes  Requested medication (s) are on the active medication list: yes  Last refill:  10/31/2019  Future visit scheduled: yes  Notes to clinic: this refill cannot be delegated    Requested Prescriptions  Pending Prescriptions Disp Refills   pregabalin (LYRICA) 50 MG capsule [Pharmacy Med Name: Pregabalin 50MG  CAPS] 30 capsule     Sig: Take 1 capsule (50 mg total) by mouth at bedtime.      Not Delegated - Neurology:  Anticonvulsants - Controlled Failed - 11/28/2019  9:25 AM      Failed - This refill cannot be delegated      Passed - Valid encounter within last 12 months    Recent Outpatient Visits           2 months ago Well adult exam   [facility] Steele Sizer, MD   2 months ago Left elbow pain   [facility] Steele Sizer, MD   3 months ago Pre-diabetes   [facility] Steele Sizer, MD   7 months ago Essential hypertension   [facility] Steele Sizer, MD   1 year ago Essential hypertension   [facility] Steele Sizer, MD       Future Appointments             In 2 months Steele Sizer, MD [facility], [place]   In 2 months Steele Sizer, MD [facility], [facility]

## 2020-02-02 ENCOUNTER — Other Ambulatory Visit: Payer: Self-pay | Admitting: Family Medicine

## 2020-02-09 NOTE — Progress Notes (Signed)
Name: [name] [name]   MRN: [id number]    DOB: [birth year]   Date:02/13/2020       Progress Note  Subjective  Chief Complaint  Chief Complaint  Patient presents with  . Hypertension    HPI  Left Elbow pain: seen back in April, took Meloxicam and symptoms resolved  Bloating: she has a history of hiatal hernia, she had a EGD and colonoscopy 2020  for evaluation of iron deficiency anemia and it was negative. She sates has some bloating when she eats greasy and spicy food but improved with dietary modification  Pre-diabetes: there is a family history of DM and she has apersonalhistory of gestational diabetes, denies polyphagia, polydipsia or polyuria. Last hgbA1C was up to 6.1 % . Discussed GLP-1 agonists and metformin but she would like to try life style modification first   Obesity: she is mindful about her diet, she is drinking soda seldom, she states she struggles with food since she did not have a lot of food growing up, sometimes she eats because she thinks about the past. She has also noticed since peri menopause and has been feeling more hungry lately . Discussed physical activity, portion control, eat more fiber ( celery / spinach )   HTN: she is compliant with medication, denies side effects of medication,no chest pain, palpitation or dizziness, bp is at goal today .She avoids fried food, no added salt .   Dyslipidemia: discussed lipid panel and her risk is low, no statins at this time  The 10-year ASCVD risk score Mikey Bussing DC Brooke Bonito., et al., 2013) is: 4.1%   Values used to calculate the score:     Age: 3 years     Sex: Female     Is Non-Hispanic African American: Yes     Diabetic: No     Tobacco smoker: No     Systolic Blood Pressure: 409 mmHg     Is BP treated: Yes     HDL Cholesterol: 49 mg/dL     Total Cholesterol: 233 mg/dL  Chronic low back pain: seeing chiropractor - Dr. Freddi Che weekly,but stopped since COVID-19. She states that stretching really helps with her  stiffness and pain. She states has pain goes down from left lower back to posterior thigh. Pain can be 4/10. Currently zero pain . She asked to see sub-specialist and asked about surgery, explained since pain is intermittent she is not a candidate for surgery or injections at this time. We added Lyrica last visit , she takes a dose at night and only feels mild pain when gets up in am, but resolves once she moves, no side effects   Anemia found on labs done last year, she states cycles are heavy for 3 days but lasts 7 days,taking iron, seen by Dr. Grayland Ormond and had two infusions.LMP 05/2019, she is peri-menopause and may improve her anemia. She had a negative EGD and colonoscopy in 2020. Currently taking iron but usually once a day , we will recheck levels next visit   Vitamin D def:she is taking rx vitamin D and would like a refill    Patient Active Problem List   Diagnosis Date Noted  . Special screening for malignant neoplasms, colon   . Rectal polyp   . Diverticulosis of large intestine without diverticulitis   . Hiatal hernia   . Iron deficiency anemia   . Pre-diabetes 10/13/2017  . Intermittent low back pain 10/13/2017  . History of gestational diabetes 10/13/2017  . Hyperlipidemia 05/08/2016  .  Essential hypertension 12/20/2014  . Gastro-esophageal reflux disease without esophagitis 01/25/2010    Past Surgical History:  Procedure Laterality Date  . CESAREAN SECTION    . COLONOSCOPY WITH PROPOFOL N/A 07/08/2018   Procedure: COLONOSCOPY WITH PROPOFOL;  Surgeon: Virgel Manifold, MD;  Location: [facility] ENDOSCOPY;  Service: Endoscopy;  Laterality: N/A;  . ESOPHAGOGASTRODUODENOSCOPY (EGD) WITH PROPOFOL N/A 07/08/2018   Procedure: ESOPHAGOGASTRODUODENOSCOPY (EGD) WITH PROPOFOL;  Surgeon: Virgel Manifold, MD;  Location: [facility] ENDOSCOPY;  Service: Endoscopy;  Laterality: N/A;  . TEAR DUCT PROBING      Family History  Problem Relation Age of Onset  . Hypertension Mother   .  Hyperlipidemia Mother   . Hypertension Father   . Breast cancer Neg Hx     Social History   Tobacco Use  . Smoking status: Never Smoker  . Smokeless tobacco: Never Used  Substance Use Topics  . Alcohol use: Yes    Alcohol/week: 1.0 standard drink    Types: 1 Glasses of wine per week    Comment: seldom  every 4-5 months     Current Outpatient Medications:  .  ferrous sulfate 325 (65 FE) MG EC tablet, Take 1 tablet (325 mg total) by mouth 2 (two) times daily., Disp: 180 tablet, Rfl: 1 .  lisinopril-hydrochlorothiazide (ZESTORETIC) 10-12.5 MG tablet, Take 1 tablet by mouth daily., Disp: 90 tablet, Rfl: 1 .  naproxen (NAPROSYN) 500 MG tablet, Take 1 tablet (500 mg total) by mouth 2 (two) times daily with a meal. (Patient taking differently: Take 500 mg by mouth as needed. ), Disp: 30 tablet, Rfl: 0 .  Omega-3 Fatty Acids (FISH OIL) 645 MG CAPS, Take by mouth., Disp: , Rfl:  .  omeprazole (PRILOSEC) 40 MG capsule, Take 1 capsule (40 mg total) by mouth daily., Disp: 30 capsule, Rfl: 1 .  pregabalin (LYRICA) 50 MG capsule, TAKE 1 CAPSULE (50 MG TOTAL) BY MOUTH AT BEDTIME., Disp: 30 capsule, Rfl: 2 .  STOOL SOFTENER 100 MG capsule, TAKE 1 CAPSULE BY MOUTH TWICE A DAY WITH IRON PILLS FOR CONSTIPATION, Disp: 60 capsule, Rfl: 2 .  Vitamin D, Ergocalciferol, (DRISDOL) 1.25 MG (50000 UNIT) CAPS capsule, Take 1 capsule (50,000 Units total) by mouth every 7 (seven) days., Disp: 12 capsule, Rfl: 1  Allergies  Allergen Reactions  . Zantac [Ranitidine Hcl]     I personally reviewed active problem list, medication list, allergies, family history, social history, health maintenance with the patient/caregiver today.   ROS  Constitutional: Negative for fever or weight change.  Respiratory: Negative for cough and shortness of breath.   Cardiovascular: Negative for chest pain or palpitations.  Gastrointestinal: Negative for abdominal pain, no bowel changes.  Musculoskeletal: Negative for gait  problem or joint swelling.  Skin: Negative for rash.  Neurological: Negative for dizziness or headache.  No other specific complaints in a complete review of systems (except as listed in HPI above).  Objective  Vitals:   02/13/20 1013  BP: 124/70  Pulse: (!) 111  Resp: 16  Temp: 98.3 F (36.8 C)  TempSrc: Oral  SpO2: 99%  Weight: 153 lb 12.8 oz (69.8 kg)  Height: 4\' 11"  (1.499 m)    Body mass index is 31.06 kg/m.  Physical Exam  Constitutional: Patient appears well-developed and well-nourished. Obese  No distress.  HEENT: head atraumatic, normocephalic, pupils equal and reactive to light, neck supple Cardiovascular: Normal rate, regular rhythm and normal heart sounds.  No murmur heard. No BLE edema. Pulmonary/Chest: Effort normal and breath  sounds normal. No respiratory distress. Abdominal: Soft.  There is no tenderness. Psychiatric: Patient has a normal mood and affect. behavior is normal. Judgment and thought content normal.  PHQ2/9: Depression screen [facility] 2/9 02/13/2020 09/20/2019 09/07/2019 08/11/2019 04/10/2019  Decreased Interest 0 0 0 0 0  Down, Depressed, Hopeless 0 0 0 0 0  PHQ - 2 Score 0 0 0 0 0  Altered sleeping - 0 0 0 0  Tired, decreased energy - 0 0 0 0  Change in appetite - 0 0 0 0  Feeling bad or failure about yourself  - 0 0 0 0  Trouble concentrating - 0 0 0 0  Moving slowly or fidgety/restless - 0 0 0 0  Suicidal thoughts - 0 0 0 0  PHQ-9 Score - 0 0 0 0  Difficult doing work/chores - - - Not difficult at all -    phq 9 is negative   Fall Risk: Fall Risk  02/13/2020 09/07/2019 08/11/2019 04/10/2019 10/14/2018  Falls in the past year? 0 0 0 0 0  Number falls in past yr: 0 0 0 0 0  Injury with Fall? 0 0 0 0 0     Functional Status Survey: Is the patient deaf or have difficulty hearing?: No Does the patient have difficulty seeing, even when wearing glasses/contacts?: No Does the patient have difficulty concentrating, remembering, or making decisions?:  No Does the patient have difficulty walking or climbing stairs?: No Does the patient have difficulty dressing or bathing?: No Does the patient have difficulty doing errands alone such as visiting a doctor's office or shopping?: No    Assessment & Plan  1. Pre-diabetes   2. Dyslipidemia   3. Essential hypertension  - lisinopril-hydrochlorothiazide (ZESTORETIC) 10-12.5 MG tablet; Take 1 tablet by mouth daily.  Dispense: 90 tablet; Refill: 1  4. History of gestational diabetes   5. Obesity (BMI 30.0-34.9)  Discussed with the patient the risk posed by an increased BMI. Discussed importance of portion control, calorie counting and at least 150 minutes of physical activity weekly. Avoid sweet beverages and drink more water. Eat at least 6 servings of fruit and vegetables daily   6. Sciatica associated with disorder of lumbar spine  - pregabalin (LYRICA) 50 MG capsule; Take 1 capsule (50 mg total) by mouth at bedtime.  Dispense: 90 capsule; Refill: 1  7. Pure hypercholesterolemia   8. Vitamin D insufficiency  - Vitamin D, Ergocalciferol, (DRISDOL) 1.25 MG (50000 UNIT) CAPS capsule; Take 1 capsule (50,000 Units total) by mouth every 7 (seven) days.  Dispense: 12 capsule; Refill: 1  9. Peri-menopausal

## 2020-02-13 ENCOUNTER — Other Ambulatory Visit: Payer: Self-pay

## 2020-02-13 ENCOUNTER — Ambulatory Visit: Payer: BC Managed Care – PPO | Admitting: Family Medicine

## 2020-02-13 ENCOUNTER — Encounter: Payer: Self-pay | Admitting: Family Medicine

## 2020-02-13 VITALS — BP 124/70 | HR 111 | Temp 98.3°F | Resp 16 | Ht 59.0 in | Wt 153.8 lb

## 2020-02-13 DIAGNOSIS — E7800 Pure hypercholesterolemia, unspecified: Secondary | ICD-10-CM

## 2020-02-13 DIAGNOSIS — I10 Essential (primary) hypertension: Secondary | ICD-10-CM

## 2020-02-13 DIAGNOSIS — E669 Obesity, unspecified: Secondary | ICD-10-CM

## 2020-02-13 DIAGNOSIS — N951 Menopausal and female climacteric states: Secondary | ICD-10-CM

## 2020-02-13 DIAGNOSIS — E559 Vitamin D deficiency, unspecified: Secondary | ICD-10-CM

## 2020-02-13 DIAGNOSIS — M5386 Other specified dorsopathies, lumbar region: Secondary | ICD-10-CM

## 2020-02-13 DIAGNOSIS — E785 Hyperlipidemia, unspecified: Secondary | ICD-10-CM | POA: Diagnosis not present

## 2020-02-13 DIAGNOSIS — Z8632 Personal history of gestational diabetes: Secondary | ICD-10-CM

## 2020-02-13 DIAGNOSIS — R7303 Prediabetes: Secondary | ICD-10-CM

## 2020-02-13 MED ORDER — LISINOPRIL-HYDROCHLOROTHIAZIDE 10-12.5 MG PO TABS: 1.0000 | ORAL_TABLET | Freq: Every day | ORAL | 1 refills | Status: DC

## 2020-02-13 MED ORDER — PREGABALIN 50 MG PO CAPS: 50.0000 mg | ORAL_CAPSULE | Freq: Every day | ORAL | 1 refills | Status: DC

## 2020-02-13 MED ORDER — VITAMIN D (ERGOCALCIFEROL) 1.25 MG (50000 UNIT) PO CAPS: 50000.0000 [IU] | ORAL_CAPSULE | ORAL | 1 refills | Status: DC

## 2020-02-20 ENCOUNTER — Ambulatory Visit: Payer: BC Managed Care – PPO | Admitting: Family Medicine

## 2020-04-17 ENCOUNTER — Other Ambulatory Visit: Payer: Self-pay | Admitting: Family Medicine

## 2020-04-17 NOTE — Telephone Encounter (Signed)
Requested medication (s) are due for refill today: yes  Requested medication (s) are on the active medication list: yes  Last refill: 03/19/20  Future visit scheduled: yes  Notes to clinic: per pharmacy request new prescription needed    Requested Prescriptions  Pending Prescriptions Disp Refills   ferrous sulfate 325 (65 FE) MG EC tablet [Pharmacy Med Name: Ferrous Sulfate 325 (65 Fe)MG TBEC] 60 tablet     Sig: Take 1 tablet (325 mg total) by mouth 2 (two) times daily.      Endocrinology:  Minerals - Iron Supplementation Failed - 04/17/2020  7:49 AM      Failed - HGB in normal range and within 360 days    Hemoglobin  Date Value Ref Range Status  08/21/2019 11.5 (L) 12.0 - 15.0 g/dL Final          Failed - HCT in normal range and within 360 days    HCT  Date Value Ref Range Status  08/21/2019 34.9 (L) 36 - 46 % Final          Passed - RBC in normal range and within 360 days    RBC  Date Value Ref Range Status  08/21/2019 4.05 3.87 - 5.11 MIL/uL Final          Passed - Fe (serum) in normal range and within 360 days    Iron  Date Value Ref Range Status  08/21/2019 65 28 - 170 ug/dL Final   %SAT  Date Value Ref Range Status  08/10/2018 4 (L) 16 - 45 % (calc) Final   Saturation Ratios  Date Value Ref Range Status  08/21/2019 17 10.4 - 31.8 % Final          Passed - Ferritin in normal range and within 360 days    Ferritin  Date Value Ref Range Status  08/21/2019 25 11 - 307 ng/mL Final    Comment:    Performed at [facility], [address], [place], [place] [address]          Passed - Valid encounter within last 12 months    Recent Outpatient Visits           2 months ago Pre-diabetes   [facility] Steele Sizer, MD   7 months ago Well adult exam   [facility] Steele Sizer, MD   7 months ago Left elbow pain   [facility] Steele Sizer, MD   8 months ago Pre-diabetes    [facility] Steele Sizer, MD   1 year ago Essential hypertension   [facility] Steele Sizer, MD       Future Appointments             In 3 months Ancil Boozer, Drue Stager, MD [facility], [place]   In 5 months Steele Sizer, MD [facility], [facility]

## 2020-08-09 NOTE — Progress Notes (Signed)
Name: [name] [name]   MRN: [id number]    DOB: 12-29-1970   Date:08/12/2020       Progress Note  Subjective  Chief Complaint  Follow Up  HPI  Pre-diabetes: there is a family history of DM and she has apersonalhistory of gestational diabetes, denies polyphagia, polydipsia or polyuria. Last hgbA1C was up to 6.1 % .She currently is not interested in taking medications  Obesity: she is mindful about her diet, she is drinking soda seldom, she states she struggles with food since she did not have a lot of food growing up, and is trying to be aware of that. She has also noticed since peri menopause - LMP 11/2019 Discussed physical activity, portion control, she has been adding fruit and vegetables for snacks.   HTN: she is compliant with medication, denies side effects of medication,no chest pain, palpitation or dizziness. BP is at goal today , and she has not taken bp medication yet, we will change from combo lisinopril hctz , we will continue hctz   Dyslipidemia: discussed lipid panel and her risk is low, no statins at this time  The 10-year ASCVD risk score Mikey Bussing DC Brooke Bonito., et al., 2013) is: 2.9%   Values used to calculate the score:     Age: 50 years     Sex: Female     Is Non-Hispanic African American: Yes     Diabetic: No     Tobacco smoker: No     Systolic Blood Pressure: 102 mmHg     Is BP treated: Yes     HDL Cholesterol: 49 mg/dL     Total Cholesterol: 233 mg/dL  Chronic low back pain: she is still getting massages, she stopped going to  Dr. Roxine Caddy during the pandemic.She states has pain goes down from left lower back to posterior thigh. Pain can be from zero to 6/10, usually worse at the end of the day. She takes Lyrica  at night and only feels mild pain when gets up in am, but resolves once she moves and notices again at the end of the day, she states dose seems to be working well for her and helps with her sleep   Anemia found on labs done last year, she  states cycles are heavy for 3 days but lasts 7 days,taking iron, seen by Dr. Grayland Ormond and had two infusions.LMP 11/2019  she is peri-menopause and may improve her anemia. She had a negative EGD and colonoscopy in 2020. Currently taking iron but usually once a day, we will recheck labs today   GERD: symptoms are intermittent, feels epigastric discomfort ( cannot explain) but resolves by itself, resolves by itself, lasts only a few seconds.   Vitamin D def:she takes vitamin D , we will recheck level   Patient Active Problem List   Diagnosis Date Noted  . Special screening for malignant neoplasms, colon   . Rectal polyp   . Diverticulosis of large intestine without diverticulitis   . Hiatal hernia   . Iron deficiency anemia   . Pre-diabetes 10/13/2017  . Intermittent low back pain 10/13/2017  . History of gestational diabetes 10/13/2017  . Hyperlipidemia 05/08/2016  . Essential hypertension 12/20/2014  . Gastro-esophageal reflux disease without esophagitis 01/25/2010    Past Surgical History:  Procedure Laterality Date  . CESAREAN SECTION    . COLONOSCOPY WITH PROPOFOL N/A 07/08/2018   Procedure: COLONOSCOPY WITH PROPOFOL;  Surgeon: Virgel Manifold, MD;  Location: [facility] ENDOSCOPY;  Service: Endoscopy;  Laterality: N/A;  .  ESOPHAGOGASTRODUODENOSCOPY (EGD) WITH PROPOFOL N/A 07/08/2018   Procedure: ESOPHAGOGASTRODUODENOSCOPY (EGD) WITH PROPOFOL;  Surgeon: Virgel Manifold, MD;  Location: [facility] ENDOSCOPY;  Service: Endoscopy;  Laterality: N/A;  . TEAR DUCT PROBING      Family History  Problem Relation Age of Onset  . Hypertension Mother   . Hyperlipidemia Mother   . Hypertension Father   . Breast cancer Neg Hx     Social History   Tobacco Use  . Smoking status: Never Smoker  . Smokeless tobacco: Never Used  Substance Use Topics  . Alcohol use: Yes    Alcohol/week: 1.0 standard drink    Types: 1 Glasses of wine per week    Comment: seldom  every 4-5 months      Current Outpatient Medications:  .  ferrous sulfate 325 (65 FE) MG EC tablet, TAKE 1 TABLET (325 MG TOTAL) BY MOUTH 2 (TWO) TIMES DAILY., Disp: 180 tablet, Rfl: 1 .  lisinopril-hydrochlorothiazide (ZESTORETIC) 10-12.5 MG tablet, Take 1 tablet by mouth daily., Disp: 90 tablet, Rfl: 1 .  naproxen (NAPROSYN) 500 MG tablet, Take 1 tablet (500 mg total) by mouth 2 (two) times daily with a meal. (Patient taking differently: Take 500 mg by mouth as needed.), Disp: 30 tablet, Rfl: 0 .  pregabalin (LYRICA) 50 MG capsule, Take 1 capsule (50 mg total) by mouth at bedtime., Disp: 90 capsule, Rfl: 1 .  Vitamin D, Ergocalciferol, (DRISDOL) 1.25 MG (50000 UNIT) CAPS capsule, Take 1 capsule (50,000 Units total) by mouth every 7 (seven) days., Disp: 12 capsule, Rfl: 1 .  Omega-3 Fatty Acids (FISH OIL) 645 MG CAPS, Take by mouth. (Patient not taking: Reported on 08/12/2020), Disp: , Rfl:  .  omeprazole (PRILOSEC) 40 MG capsule, Take 1 capsule (40 mg total) by mouth daily. (Patient not taking: Reported on 08/12/2020), Disp: 30 capsule, Rfl: 1  Allergies  Allergen Reactions  . Zantac [Ranitidine Hcl]     I personally reviewed active problem list, medication list, allergies, family history, social history, health maintenance with the patient/caregiver today.   ROS  Constitutional: Negative for fever or weight change.  Respiratory: Negative for cough and shortness of breath.   Cardiovascular: Negative for chest pain or palpitations.  Gastrointestinal: Negative for abdominal pain, no bowel changes.  Musculoskeletal: Negative for gait problem or joint swelling.  Skin: Negative for rash.  Neurological: Negative for dizziness or headache.  No other specific complaints in a complete review of systems (except as listed in HPI above).  Objective  Vitals:   08/12/20 0757  BP: 112/70  Pulse: 89  Resp: 16  Temp: 98.1 F (36.7 C)  TempSrc: Oral  SpO2: 100%  Weight: 155 lb (70.3 kg)  Height: 4\' 11"  (1.499  m)    Body mass index is 31.31 kg/m.  Physical Exam  Constitutional: Patient appears well-developed and well-nourished. Obese  No distress.  HEENT: head atraumatic, normocephalic, pupils equal and reactive to light, , neck supple Cardiovascular: Normal rate, regular rhythm and normal heart sounds.  No murmur heard. No BLE edema. Pulmonary/Chest: Effort normal and breath sounds normal. No respiratory distress. Abdominal: Soft.  There is no tenderness. Psychiatric: Patient has a normal mood and affect. behavior is normal. Judgment and thought content normal.  PHQ2/9: Depression screen [facility] 2/9 08/12/2020 02/13/2020 09/20/2019 09/07/2019 08/11/2019  Decreased Interest 0 0 0 0 0  Down, Depressed, Hopeless 0 0 0 0 0  PHQ - 2 Score 0 0 0 0 0  Altered sleeping - - 0 0 0  Tired, decreased energy - - 0 0 0  Change in appetite - - 0 0 0  Feeling bad or failure about yourself  - - 0 0 0  Trouble concentrating - - 0 0 0  Moving slowly or fidgety/restless - - 0 0 0  Suicidal thoughts - - 0 0 0  PHQ-9 Score - - 0 0 0  Difficult doing work/chores - - - - Not difficult at all    phq 9 is negative   Fall Risk: Fall Risk  08/12/2020 02/13/2020 09/07/2019 08/11/2019 04/10/2019  Falls in the past year? 0 0 0 0 0  Number falls in past yr: 0 0 0 0 0  Injury with Fall? 0 0 0 0 0     Functional Status Survey: Is the patient deaf or have difficulty hearing?: No Does the patient have difficulty seeing, even when wearing glasses/contacts?: No Does the patient have difficulty concentrating, remembering, or making decisions?: No Does the patient have difficulty walking or climbing stairs?: No Does the patient have difficulty dressing or bathing?: No Does the patient have difficulty doing errands alone such as visiting a doctor's office or shopping?: No    Assessment & Plan  1. Pre-diabetes  - Hemoglobin A1c  2. Dyslipidemia  - Lipid panel  3. Essential hypertension  Changing to hctz only since bp  towards low end of normal and did not take medication this am  - COMPLETE METABOLIC PANEL WITH GFR - CBC with Differential/Platelet  4. Vitamin D insufficiency  - VITAMIN D 25 Hydroxy (Vit-D Deficiency, Fractures)  5. Iron deficiency anemia, unspecified iron deficiency anemia type   6. Obesity (BMI 30.0-34.9)  Discussed with the patient the risk posed by an increased BMI. Discussed importance of portion control, calorie counting and at least 150 minutes of physical activity weekly. Avoid sweet beverages and drink more water. Eat at least 6 servings of fruit and vegetables daily   7. History of gestational diabetes

## 2020-08-12 ENCOUNTER — Other Ambulatory Visit: Payer: Self-pay

## 2020-08-12 ENCOUNTER — Encounter: Payer: Self-pay | Admitting: Family Medicine

## 2020-08-12 ENCOUNTER — Ambulatory Visit: Payer: BC Managed Care – PPO | Admitting: Family Medicine

## 2020-08-12 VITALS — BP 112/70 | HR 89 | Temp 98.1°F | Resp 16 | Ht 59.0 in | Wt 155.0 lb

## 2020-08-12 DIAGNOSIS — M5386 Other specified dorsopathies, lumbar region: Secondary | ICD-10-CM

## 2020-08-12 DIAGNOSIS — E785 Hyperlipidemia, unspecified: Secondary | ICD-10-CM

## 2020-08-12 DIAGNOSIS — E559 Vitamin D deficiency, unspecified: Secondary | ICD-10-CM

## 2020-08-12 DIAGNOSIS — I10 Essential (primary) hypertension: Secondary | ICD-10-CM | POA: Diagnosis not present

## 2020-08-12 DIAGNOSIS — R7303 Prediabetes: Secondary | ICD-10-CM | POA: Diagnosis not present

## 2020-08-12 DIAGNOSIS — D509 Iron deficiency anemia, unspecified: Secondary | ICD-10-CM

## 2020-08-12 DIAGNOSIS — E669 Obesity, unspecified: Secondary | ICD-10-CM

## 2020-08-12 DIAGNOSIS — Z8632 Personal history of gestational diabetes: Secondary | ICD-10-CM

## 2020-08-12 MED ORDER — VITAMIN D (ERGOCALCIFEROL) 1.25 MG (50000 UNIT) PO CAPS: 50000.0000 [IU] | ORAL_CAPSULE | ORAL | 1 refills | Status: DC

## 2020-08-12 MED ORDER — PREGABALIN 50 MG PO CAPS: 50.0000 mg | ORAL_CAPSULE | Freq: Every day | ORAL | 1 refills | Status: DC

## 2020-08-12 MED ORDER — HYDROCHLOROTHIAZIDE 12.5 MG PO TABS: 12.5000 mg | ORAL_TABLET | Freq: Every day | ORAL | 0 refills | Status: DC

## 2020-08-12 MED ORDER — DOCUSATE SODIUM 100 MG PO CAPS: 100.0000 mg | ORAL_CAPSULE | Freq: Two times a day (BID) | ORAL | 1 refills | Status: DC

## 2020-08-12 MED ORDER — FERROUS SULFATE 325 (65 FE) MG PO TBEC: 325.0000 mg | DELAYED_RELEASE_TABLET | Freq: Two times a day (BID) | ORAL | 1 refills | Status: DC

## 2020-08-13 LAB — COMPLETE METABOLIC PANEL WITH GFR
AG Ratio: 1.8 (calc) (ref 1.0–2.5)
ALT: 16 U/L (ref 6–29)
AST: 16 U/L (ref 10–35)
Albumin: 4.7 g/dL (ref 3.6–5.1)
Alkaline phosphatase (APISO): 64 U/L (ref 37–153)
BUN: 14 mg/dL (ref 7–25)
CO2: 32 mmol/L (ref 20–32)
Calcium: 10.2 mg/dL (ref 8.6–10.4)
Chloride: 103 mmol/L (ref 98–110)
Creat: 0.91 mg/dL (ref 0.50–1.05)
GFR, Est African American: 85 mL/min/{1.73_m2} (ref >=60)
GFR, Est Non African American: 73 mL/min/{1.73_m2} (ref >=60)
Globulin: 2.6 g/dL (calc) (ref 1.9–3.7)
Glucose, Bld: 96 mg/dL (ref 65–99)
Potassium: 4.4 mmol/L (ref 3.5–5.3)
Sodium: 141 mmol/L (ref 135–146)
Total Bilirubin: 0.3 mg/dL (ref 0.2–1.2)
Total Protein: 7.3 g/dL (ref 6.1–8.1)

## 2020-08-13 LAB — CBC WITH DIFFERENTIAL/PLATELET
Absolute Monocytes: 604 cells/uL (ref 200–950)
Basophils Absolute: 43 cells/uL (ref 0–200)
Basophils Relative: 0.6 %
Eosinophils Absolute: 192 cells/uL (ref 15–500)
Eosinophils Relative: 2.7 %
HCT: 40.6 % (ref 35.0–45.0)
Hemoglobin: 13.4 g/dL (ref 11.7–15.5)
Lymphs Abs: 1285 cells/uL (ref 850–3900)
MCH: 29.1 pg (ref 27.0–33.0)
MCHC: 33 g/dL (ref 32.0–36.0)
MCV: 88.3 fL (ref 80.0–100.0)
MPV: 10.5 fL (ref 7.5–12.5)
Monocytes Relative: 8.5 %
Neutro Abs: 4977 cells/uL (ref 1500–7800)
Neutrophils Relative %: 70.1 %
Platelets: 381 10*3/uL (ref 140–400)
RBC: 4.6 10*6/uL (ref 3.80–5.10)
RDW: 13.6 % (ref 11.0–15.0)
Total Lymphocyte: 18.1 %
WBC: 7.1 10*3/uL (ref 3.8–10.8)

## 2020-08-13 LAB — LIPID PANEL
Cholesterol: 233 mg/dL — ABNORMAL HIGH (ref <200)
HDL: 52 mg/dL (ref >=50)
LDL Cholesterol (Calc): 161 mg/dL (calc) — ABNORMAL HIGH
Non-HDL Cholesterol (Calc): 181 mg/dL (calc) — ABNORMAL HIGH (ref <130)
Total CHOL/HDL Ratio: 4.5 (calc) (ref <5.0)
Triglycerides: 95 mg/dL (ref <150)

## 2020-08-13 LAB — HEMOGLOBIN A1C
Hgb A1c MFr Bld: 6.1 % of total Hgb — ABNORMAL HIGH (ref <5.7)
Mean Plasma Glucose: 128 mg/dL
eAG (mmol/L): 7.1 mmol/L

## 2020-08-13 LAB — VITAMIN D 25 HYDROXY (VIT D DEFICIENCY, FRACTURES): Vit D, 25-Hydroxy: 75 ng/mL (ref 30–100)

## 2020-09-18 ENCOUNTER — Encounter: Payer: BC Managed Care – PPO | Admitting: Family Medicine

## 2020-11-11 ENCOUNTER — Other Ambulatory Visit: Payer: Self-pay | Admitting: Family Medicine

## 2020-11-11 DIAGNOSIS — I10 Essential (primary) hypertension: Secondary | ICD-10-CM

## 2020-11-11 NOTE — Telephone Encounter (Signed)
Requested Prescriptions  Pending Prescriptions Disp Refills  . hydrochlorothiazide (MICROZIDE) 12.5 MG capsule [Pharmacy Med Name: hydroCHLOROthiazide 12.5MG  CAPS*] 90 capsule 0    Sig: TAKE 1 CAPSULE (12.5 MG TOTAL) BY MOUTH DAILY.     Cardiovascular: Diuretics - Thiazide Passed - 11/11/2020 12:20 PM      Passed - Ca in normal range and within 360 days    Calcium  Date Value Ref Range Status  08/12/2020 10.2 8.6 - 10.4 mg/dL Final         Passed - Cr in normal range and within 360 days    Creat  Date Value Ref Range Status  08/12/2020 0.91 0.50 - 1.05 mg/dL Final    Comment:    For patients >71 years of age, the reference limit for Creatinine is approximately 13% higher for people identified as African-American. .          Passed - K in normal range and within 360 days    Potassium  Date Value Ref Range Status  08/12/2020 4.4 3.5 - 5.3 mmol/L Final         Passed - Na in normal range and within 360 days    Sodium  Date Value Ref Range Status  08/12/2020 141 135 - 146 mmol/L Final         Passed - Last BP in normal range    BP Readings from Last 1 Encounters:  08/12/20 112/70         Passed - Valid encounter within last 6 months    Recent Outpatient Visits          3 months ago Pre-diabetes   [facility] Steele Sizer, MD   9 months ago Pre-diabetes   [facility] Steele Sizer, MD   1 year ago Well adult exam   [facility] Steele Sizer, MD   1 year ago Left elbow pain   [facility] Steele Sizer, MD   1 year ago Pre-diabetes   [facility] Steele Sizer, MD      Future Appointments            In 1 month Steele Sizer, MD [facility], [place]   In 3 months Steele Sizer, MD [facility], [facility]

## 2020-11-28 ENCOUNTER — Other Ambulatory Visit: Payer: Self-pay | Admitting: Family Medicine

## 2020-11-28 DIAGNOSIS — E559 Vitamin D deficiency, unspecified: Secondary | ICD-10-CM

## 2020-11-28 NOTE — Telephone Encounter (Signed)
Requested medication (s) are due for refill today: yes  Requested medication (s) are on the active medication list: yes  Last refill: 11/11/2020  Future visit scheduled: Yes  Notes to clinic: 50,000 IU strengths are not delegated   Requested Prescriptions  Pending Prescriptions Disp Refills   Vitamin D, Ergocalciferol, (DRISDOL) 1.25 MG (50000 UNIT) CAPS capsule [Pharmacy Med Name: Vitamin D (Ergocalciferol) 1.25 MG(50000 UT) CAPS] 4 capsule     Sig: Take 1 capsule (50,000 Units total) by mouth every 7 (seven) days.      Endocrinology:  Vitamins - Vitamin D Supplementation Failed - 11/28/2020  1:27 PM      Failed - 50,000 IU strengths are not delegated      Failed - Phosphate in normal range and within 360 days    No results found for: PHOS        Passed - Ca in normal range and within 360 days    Calcium  Date Value Ref Range Status  08/12/2020 10.2 8.6 - 10.4 mg/dL Final          Passed - Vitamin D in normal range and within 360 days    Vit D, 25-Hydroxy  Date Value Ref Range Status  08/12/2020 75 30 - 100 ng/mL Final    Comment:    Vitamin D Status         25-OH Vitamin D: . Deficiency:                    <20 ng/mL Insufficiency:             20 - 29 ng/mL Optimal:                 > or = 30 ng/mL . For 25-OH Vitamin D testing on patients on  D2-supplementation and patients for whom quantitation  of D2 and D3 fractions is required, the [employer](TM) 25-OH VIT D, (D2,D3), LC/MS/MS is recommended: order  code [id number] (patients >31yrs). See Note 1 . Note 1 . For additional information, please refer to  http://education.[facility].com/faq/FAQ199  (This link is being provided for informational/ educational purposes only.)           Passed - Valid encounter within last 12 months    Recent Outpatient Visits           3 months ago Pre-diabetes   [facility] Steele Sizer, MD   9 months ago Pre-diabetes   [facility] Steele Sizer, MD   1 year ago Well adult exam   [facility] Steele Sizer, MD   1 year ago Left elbow pain   [facility] Steele Sizer, MD   1 year ago Pre-diabetes   [facility] Steele Sizer, MD       Future Appointments             In 3 weeks Steele Sizer, MD [facility], [place]   In 1 month Steele Sizer, MD [facility], [place]   In 2 months Steele Sizer, MD [facility]

## 2020-11-28 NOTE — Telephone Encounter (Signed)
Pt has an appt on 12/16/2020

## 2020-12-13 ENCOUNTER — Ambulatory Visit: Payer: Self-pay | Admitting: *Deleted

## 2020-12-13 NOTE — Telephone Encounter (Signed)
Patient is calling to report + COVID- symptoms: cough, fatigue, chest congestion. Patient advised per COVID protocol, advised virtual visit option over weekend for increasing symptoms, note sent to office for review.

## 2020-12-13 NOTE — Telephone Encounter (Signed)
Reason for Disposition  [1] COVID-19 diagnosed by positive lab test (e.g., PCR, rapid self-test kit) AND [2] mild symptoms (e.g., cough, fever, others) AND [3] no complications or SOB  Answer Assessment - Initial Assessment Questions 1. COVID-19 DIAGNOSIS: "Who made your COVID-19 diagnosis?" "Was it confirmed by a positive lab test or self-test?" If not diagnosed by a doctor (or NP/PA), ask "Are there lots of cases (community spread) where you live?" Note: See public health department website, if unsure.     + home test- yesterday 2. COVID-19 EXPOSURE: "Was there any known exposure to COVID before the symptoms began?" CDC Definition of close contact: within 6 feet (2 meters) for a total of 15 minutes or more over a 24-hour period.      Family function 3. ONSET: "When did the COVID-19 symptoms start?"      Wednesday pm 4. WORST SYMPTOM: "What is your worst symptom?" (e.g., cough, fever, shortness of breath, muscle aches)     cough 5. COUGH: "Do you have a cough?" If Yes, ask: "How bad is the cough?"       Yes- cough with talking 6. FEVER: "Do you have a fever?" If Yes, ask: "What is your temperature, how was it measured, and when did it start?"     no 7. RESPIRATORY STATUS: "Describe your breathing?" (e.g., shortness of breath, wheezing, unable to speak)      Coughing sputum- yellowish 8. BETTER-SAME-WORSE: "Are you getting better, staying the same or getting worse compared to yesterday?"  If getting worse, ask, "In what way?"     better 9. HIGH RISK DISEASE: "Do you have any chronic medical problems?" (e.g., asthma, heart or lung disease, weak immune system, obesity, etc.)     no 10. VACCINE: "Have you had the COVID-19 vaccine?" If Yes, ask: "Which one, how many shots, when did you get it?"       Yes- moderna 11. BOOSTER: "Have you received your COVID-19 booster?" If Yes, ask: "Which one and when did you get it?"       Yes- 1 booster 12. PREGNANCY: "Is there any chance you are pregnant?"  "When was your last menstrual period?"       No- BTL 13. OTHER SYMPTOMS: "Do you have any other symptoms?"  (e.g., chills, fatigue, headache, loss of smell or taste, muscle pain, sore throat)       Fatigue, chills 14. O2 SATURATION MONITOR:  "Do you use an oxygen saturation monitor (pulse oximeter) at home?" If Yes, ask "What is your reading (oxygen level) today?" "What is your usual oxygen saturation reading?" (e.g., 95%)       No  Protocols used: Coronavirus (COVID-19) Diagnosed or Suspected-A-AH

## 2020-12-18 ENCOUNTER — Encounter: Payer: Self-pay | Admitting: Oncology

## 2020-12-25 ENCOUNTER — Ambulatory Visit: Payer: BC Managed Care – PPO | Admitting: Family Medicine

## 2021-01-07 NOTE — Patient Instructions (Addendum)
Preventive Care 40-52 Years Old, Female  Preventive care refers to lifestyle choices and visits with your health care provider that can promote health and wellness. This includes: A yearly physical exam. This is also called an annual wellness visit. Regular dental and eye exams. Immunizations. Screening for certain conditions. Healthy lifestyle choices, such as: Eating a healthy diet. Getting regular exercise. Not using drugs or products that contain nicotine and tobacco. Limiting alcohol use. What can I expect for my preventive care visit? Physical exam Your health care provider will check your: Height and weight. These may be used to calculate your BMI (body mass index). BMI is a measurement that tells if you are at a healthy weight. Heart rate and blood pressure. Body temperature. Skin for abnormal spots. Counseling Your health care provider may ask you questions about your: Past medical problems. Family's medical history. Alcohol, tobacco, and drug use. Emotional well-being. Home life and relationship well-being. Sexual activity. Diet, exercise, and sleep habits. Work and work environment. Access to firearms. Method of birth control. Menstrual cycle. Pregnancy history. What immunizations do I need?  Vaccines are usually given at various ages, according to a schedule. Your health care provider will recommend vaccines for you based on your age, medicalhistory, and lifestyle or other factors, such as travel or where you work. What tests do I need? Blood tests Lipid and cholesterol levels. These may be checked every 5 years, or more often if you are over 50 years old. Hepatitis C test. Hepatitis B test. Screening Lung cancer screening. You may have this screening every year starting at age 55 if you have a 30-pack-year history of smoking and currently smoke or have quit within the past 15 years. Colorectal cancer screening. All adults should have this screening starting at  age 50 and continuing until age 75. Your health care provider may recommend screening at age 45 if you are at increased risk. You will have tests every 1-10 years, depending on your results and the type of screening test. Diabetes screening. This is done by checking your blood sugar (glucose) after you have not eaten for a while (fasting). You may have this done every 1-3 years. Mammogram. This may be done every 1-2 years. Talk with your health care provider about when you should start having regular mammograms. This may depend on whether you have a family history of breast cancer. BRCA-related cancer screening. This may be done if you have a family history of breast, ovarian, tubal, or peritoneal cancers. Pelvic exam and Pap test. This may be done every 3 years starting at age 21. Starting at age 30, this may be done every 5 years if you have a Pap test in combination with an HPV test. Other tests STD (sexually transmitted disease) testing, if you are at risk. Bone density scan. This is done to screen for osteoporosis. You may have this scan if you are at high risk for osteoporosis. Talk with your health care provider about your test results, treatment options,and if necessary, the need for more tests. Follow these instructions at home: Eating and drinking  Eat a diet that includes fresh fruits and vegetables, whole grains, lean protein, and low-fat dairy products. Take vitamin and mineral supplements as recommended by your health care provider. Do not drink alcohol if: Your health care provider tells you not to drink. You are pregnant, may be pregnant, or are planning to become pregnant. If you drink alcohol: Limit how much you have to 0-1 drink a day. Be   aware of how much alcohol is in your drink. In the [place] one drink equals one 12 oz bottle of beer (355 mL), one 5 oz glass of wine (148 mL), or one 1 oz glass of hard liquor (44 mL).  Lifestyle Take daily care of your teeth and  gums. Brush your teeth every morning and night with fluoride toothpaste. Floss one time each day. Stay active. Exercise for at least 30 minutes 5 or more days each week. Do not use any products that contain nicotine or tobacco, such as cigarettes, e-cigarettes, and chewing tobacco. If you need help quitting, ask your health care provider. Do not use drugs. If you are sexually active, practice safe sex. Use a condom or other form of protection to prevent STIs (sexually transmitted infections). If you do not wish to become pregnant, use a form of birth control. If you plan to become pregnant, see your health care provider for a prepregnancy visit. If told by your health care provider, take low-dose aspirin daily starting at age 59. Find healthy ways to cope with stress, such as: Meditation, yoga, or listening to music. Journaling. Talking to a trusted person. Spending time with friends and family. Safety Always wear your seat belt while driving or riding in a vehicle. Do not drive: If you have been drinking alcohol. Do not ride with someone who has been drinking. When you are tired or distracted. While texting. Wear a helmet and other protective equipment during sports activities. If you have firearms in your house, make sure you follow all gun safety procedures. What's next? Visit your health care provider once a year for an annual wellness visit. Ask your health care provider how often you should have your eyes and teeth checked. Stay up to date on all vaccines. This information is not intended to replace advice given to you by your health care provider. Make sure you discuss any questions you have with your healthcare provider. Medications for weight loss:  [name] [name] , tell them your BMI is 31    Document Revised: 02/27/2020 Document Reviewed: 02/03/2018 Elsevier Patient Education  2022 [employer].

## 2021-01-07 NOTE — Progress Notes (Signed)
Name: [name] [name]   MRN: [id number]    DOB: [birth year]   Date:01/08/2021       Progress Note  Subjective  Chief Complaint  Annual Exam  HPI  Patient presents for annual CPE.  Diet: she eats a balanced diet, cooks at home, a lot of chicken, salmon, fruit and vegetable.  Exercise: she is going to join gym again tomorrow , needs 150 minutes per week  [place] Visit from 01/08/2021 in [facility]  AUDIT-C Score 0      Depression: Phq 9 is  negative Depression screen [facility] 2/9 01/08/2021 08/12/2020 02/13/2020 09/20/2019 09/07/2019  Decreased Interest 0 0 0 0 0  Down, Depressed, Hopeless 0 0 0 0 0  PHQ - 2 Score 0 0 0 0 0  Altered sleeping - - - 0 0  Tired, decreased energy - - - 0 0  Change in appetite - - - 0 0  Feeling bad or failure about yourself  - - - 0 0  Trouble concentrating - - - 0 0  Moving slowly or fidgety/restless - - - 0 0  Suicidal thoughts - - - 0 0  PHQ-9 Score - - - 0 0  Difficult doing work/chores - - - - -   Hypertension: BP Readings from Last 3 Encounters:  01/08/21 136/84  08/12/20 112/70  02/13/20 124/70   Obesity: Wt Readings from Last 3 Encounters:  01/08/21 157 lb (71.2 kg)  08/12/20 155 lb (70.3 kg)  02/13/20 153 lb 12.8 oz (69.8 kg)   BMI Readings from Last 3 Encounters:  01/08/21 31.71 kg/m  08/12/20 31.31 kg/m  02/13/20 31.06 kg/m     Vaccines:   Shingrix: today  Pneumonia: educated and discussed with patient. Flu: educated and discussed with patient.  Hep C Screening: 08/10/18 STD testing and prevention (HIV/chl/gon/syphilis): 10/13/17 Intimate partner violence:negative Sexual History : one partner, libido is up and down  Menstrual History/LMP/Abnormal Bleeding: she went 10 months without a cycle, LMP this time 10/2020  Incontinence Symptoms: no problems   Breast cancer:  - Last Mammogram: 10/02/19 - BRCA gene screening: N/A  Osteoporosis: Discussed high calcium and vitamin D supplementation, weight  bearing exercises  Cervical cancer screening: 08/10/18  Skin cancer: Discussed monitoring for atypical lesions  Colorectal cancer: 07/08/18   Lung cancer:  Low Dose CT Chest recommended if Age 61-80 years, 20 pack-year currently smoking OR have quit w/in 15years. Patient does not qualify.   ECG: 10/13/17  Advanced Care Planning: A voluntary discussion about advance care planning including the explanation and discussion of advance directives.  Discussed health care proxy and Living will, and the patient was able to identify a health care proxy as husband   Lipids: Lab Results  Component Value Date   CHOL 233 (H) 08/12/2020   CHOL 233 (H) 08/11/2019   CHOL 220 (H) 08/10/2018   Lab Results  Component Value Date   HDL 52 08/12/2020   HDL 49 (L) 08/11/2019   HDL 57 08/10/2018   Lab Results  Component Value Date   LDLCALC 161 (H) 08/12/2020   LDLCALC 160 (H) 08/11/2019   LDLCALC 137 (H) 08/10/2018   Lab Results  Component Value Date   TRIG 95 08/12/2020   TRIG 121 08/11/2019   TRIG 131 08/10/2018   Lab Results  Component Value Date   CHOLHDL 4.5 08/12/2020   CHOLHDL 4.8 08/11/2019   CHOLHDL 3.9 08/10/2018   No results found for: LDLDIRECT  Glucose: Glucose,  Bld  Date Value Ref Range Status  08/12/2020 96 65 - 99 mg/dL Final    Comment:    .            Fasting reference interval .   08/11/2019 85 65 - 99 mg/dL Final    Comment:    .            Fasting reference interval .   08/10/2018 74 65 - 99 mg/dL Final    Comment:    .            Fasting reference interval .     Patient Active Problem List   Diagnosis Date Noted   Special screening for malignant neoplasms, colon    Rectal polyp    Diverticulosis of large intestine without diverticulitis    Hiatal hernia    Iron deficiency anemia    Pre-diabetes 10/13/2017   Intermittent low back pain 10/13/2017   History of gestational diabetes 10/13/2017   Hyperlipidemia 05/08/2016   Essential hypertension  12/20/2014   Gastro-esophageal reflux disease without esophagitis 01/25/2010    Past Surgical History:  Procedure Laterality Date   CESAREAN SECTION     COLONOSCOPY WITH PROPOFOL N/A 07/08/2018   Procedure: COLONOSCOPY WITH PROPOFOL;  Surgeon: Virgel Manifold, MD;  Location: [facility] ENDOSCOPY;  Service: Endoscopy;  Laterality: N/A;   ESOPHAGOGASTRODUODENOSCOPY (EGD) WITH PROPOFOL N/A 07/08/2018   Procedure: ESOPHAGOGASTRODUODENOSCOPY (EGD) WITH PROPOFOL;  Surgeon: Virgel Manifold, MD;  Location: [facility] ENDOSCOPY;  Service: Endoscopy;  Laterality: N/A;   TEAR DUCT PROBING      Family History  Problem Relation Age of Onset   Hypertension Mother    Hyperlipidemia Mother    Hypertension Father    Breast cancer Neg Hx     Social History   Socioeconomic History   Marital status: Married    Spouse name: Ricky    Number of children: 2   Years of education: Not on file   Highest education level: Master's degree (e.g., MA, MS, MEng, MEd, MSW, MBA)  Occupational History   Occupation: mental health Education officer, 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   Tobacco Use   Smoking status: Never   Smokeless tobacco: Never  Vaping Use   Vaping Use: Never used  Substance and Sexual Activity   Alcohol use: Yes    Alcohol/week: 1.0 standard drink    Types: 1 Glasses of wine per week    Comment: seldom  every 4-5 months   Drug use: No   Sexual activity: Yes    Partners: Male    Birth control/protection: Surgical  Other Topics Concern   Not on file  Social History Narrative   Two boys, 14 years apart    Social Determinants of Health   Financial Resource Strain: Low Risk    Difficulty of Paying Living Expenses: Not hard at all  Food Insecurity: No Food Insecurity   Worried About Charity fundraiser in the Last Year: Never true   Arboriculturist in the Last Year: Never true  Transportation Needs: No Transportation Needs   Lack of Transportation (Medical): No   Lack of Transportation  (Non-Medical): No  Physical Activity: Inactive   Days of Exercise per Week: 0 days   Minutes of Exercise per Session: 0 min  Stress: No Stress Concern Present   Feeling of Stress : Not at all  Social Connections: Socially Integrated   Frequency of Communication with Friends and Family: More than three  times a week   Frequency of Social Gatherings with Friends and Family: Three times a week   Attends Religious Services: More than 4 times per year   Active Member of Clubs or Organizations: Yes   Attends Music therapist: More than 4 times per year   Marital Status: Married  Human resources officer Violence: Not At Risk   Fear of Current or Ex-Partner: No   Emotionally Abused: No   Physically Abused: No   Sexually Abused: No     Current Outpatient Medications:    docusate sodium (COLACE) 100 MG capsule, Take 1 capsule (100 mg total) by mouth 2 (two) times daily., Disp: 90 capsule, Rfl: 1   ferrous sulfate 325 (65 FE) MG EC tablet, Take 1 tablet (325 mg total) by mouth 2 (two) times daily., Disp: 180 tablet, Rfl: 1   hydrochlorothiazide (MICROZIDE) 12.5 MG capsule, TAKE 1 CAPSULE (12.5 MG TOTAL) BY MOUTH DAILY., Disp: 90 capsule, Rfl: 0   pregabalin (LYRICA) 50 MG capsule, Take 1 capsule (50 mg total) by mouth at bedtime., Disp: 90 capsule, Rfl: 1   Vitamin D, Ergocalciferol, (DRISDOL) 1.25 MG (50000 UNIT) CAPS capsule, TAKE 1 CAPSULE (50,000 UNITS TOTAL) BY MOUTH EVERY 7 (SEVEN) DAYS., Disp: 12 capsule, Rfl: 1  Allergies  Allergen Reactions   Zantac [Ranitidine Hcl]      ROS  Constitutional: Negative for fever or weight change.  Respiratory: Negative for cough and shortness of breath.   Cardiovascular: Negative for chest pain or palpitations.  Gastrointestinal: Negative for abdominal pain, no bowel changes.  Musculoskeletal: Negative for gait problem or joint swelling.  Skin: Negative for rash.  Neurological: Negative for dizziness or headache.  No other specific  complaints in a complete review of systems (except as listed in HPI above).   Objective  Vitals:   01/08/21 0853  BP: 136/84  Pulse: 100  Resp: 16  Temp: 98.2 F (36.8 C)  SpO2: 99%  Weight: 157 lb (71.2 kg)  Height: _0  (1.499 m)    Body mass index is 31.71 kg/m.  Physical Exam   Constitutional: Patient appears well-developed and well-nourished. No distress.  HENT: Head: Normocephalic and atraumatic. Ears: B TMs ok, no erythema or effusion; Nose: Nose normal. Mouth/Throat: not done  Eyes: Conjunctivae and EOM are normal. Pupils are equal, round, and reactive to light. No scleral icterus.  Neck: Normal range of motion. Neck supple. No JVD present. No thyromegaly present.  Cardiovascular: Normal rate, regular rhythm and normal heart sounds.  No murmur heard. No BLE edema. Pulmonary/Chest: Effort normal and breath sounds normal. No respiratory distress. Abdominal: Soft. Bowel sounds are normal, no distension. There is no tenderness. no masses Breast: no lumps or masses, no nipple discharge or rashes FEMALE GENITALIA:  Not done  RECTAL: not done  Musculoskeletal: Normal range of motion, no joint effusions. No gross deformities Neurological: he is alert and oriented to person, place, and time. No cranial nerve deficit. Coordination, balance, strength, speech and gait are normal.  Skin: Skin is warm and dry. No rash noted. No erythema.  Psychiatric: Patient has a normal mood and affect. behavior is normal. Judgment and thought content normal.   Fall Risk: Fall Risk  01/08/2021 08/12/2020 02/13/2020 09/07/2019 08/11/2019  Falls in the past year? 0 0 0 0 0  Number falls in past yr: 0 0 0 0 0  Injury with Fall? 0 0 0 0 0     Functional Status Survey: Is the patient deaf or have  difficulty hearing?: No Does the patient have difficulty seeing, even when wearing glasses/contacts?: No Does the patient have difficulty concentrating, remembering, or making decisions?: No Does the patient  have difficulty walking or climbing stairs?: No Does the patient have difficulty dressing or bathing?: No Does the patient have difficulty doing errands alone such as visiting a doctor's office or shopping?: No   Assessment & Plan  1. Well adult exam   2. Need for shingles vaccine  - Varicella-zoster vaccine IM (Shingrix)  3. Breast cancer screening by mammogram  - MM 3D SCREEN BREAST BILATERAL; Future    -USPSTF grade A and B recommendations reviewed with patient; age-appropriate recommendations, preventive care, screening tests, etc discussed and encouraged; healthy living encouraged; see AVS for patient education given to patient -Discussed importance of 150 minutes of physical activity weekly, eat two servings of fish weekly, eat one serving of tree nuts ( cashews, pistachios, pecans, almonds.Marland Kitchen) every other day, eat 6 servings of fruit/vegetables daily and drink plenty of water and avoid sweet beverages.

## 2021-01-08 ENCOUNTER — Encounter: Payer: Self-pay | Admitting: Oncology

## 2021-01-08 ENCOUNTER — Other Ambulatory Visit: Payer: Self-pay

## 2021-01-08 ENCOUNTER — Encounter: Payer: Self-pay | Admitting: Family Medicine

## 2021-01-08 ENCOUNTER — Ambulatory Visit (INDEPENDENT_AMBULATORY_CARE_PROVIDER_SITE_OTHER): Payer: BC Managed Care – PPO | Admitting: Family Medicine

## 2021-01-08 VITALS — BP 136/84 | HR 100 | Temp 98.2°F | Resp 16 | Ht 59.0 in | Wt 157.0 lb

## 2021-01-08 DIAGNOSIS — Z1231 Encounter for screening mammogram for malignant neoplasm of breast: Secondary | ICD-10-CM

## 2021-01-08 DIAGNOSIS — Z0000 Encounter for general adult medical examination without abnormal findings: Secondary | ICD-10-CM | POA: Diagnosis not present

## 2021-01-08 DIAGNOSIS — Z23 Encounter for immunization: Secondary | ICD-10-CM | POA: Diagnosis not present

## 2021-02-05 ENCOUNTER — Other Ambulatory Visit: Payer: Self-pay | Admitting: Family Medicine

## 2021-02-05 DIAGNOSIS — I10 Essential (primary) hypertension: Secondary | ICD-10-CM

## 2021-02-12 NOTE — Progress Notes (Signed)
Name: [name] [name]   MRN: [id number]    DOB: November 22, 1970   Date:02/13/2021       Progress Note  Subjective  Chief Complaint  Follow Up  HPI  Pre-diabetes: there is a family history of DM and she has a personal  history of gestational diabetes, denies polyphagia, polydipsia or polyuria. Last hgbA1C was up to 6.1 %, we discussed Metformin and GLP-1 agonisit with her today.   Obesity: she is mindful about her diet, she is drinking soda seldom, she states she struggles with food since she did not have a lot of food growing up, she is cutting down on calories - eating more balanced meals . She has also noticed since peri menopause - Had a cycle 11/2019 and another one in May 2022.    HTN: she is compliant with medication,only on HCTZ now, bp is at goal, no chest pain, palpitation or sob   Dyslipidemia: discussed lipid panel , there is a family history of heart disease but in males in the late 60's, we will monitor for now . Discussed importance healthy diet and physical activity    The 10-year ASCVD risk score (Arnett DK, et al., 2019) is: 5.2%   Values used to calculate the score:     Age: 50 years     Sex: Female     Is Non-Hispanic African American: Yes     Diabetic: No     Tobacco smoker: No     Systolic Blood Pressure: [id number] mmHg     Is BP treated: Yes     HDL Cholesterol: 52 mg/dL     Total Cholesterol: 233 mg/dL    Chronic low back pain: she is still getting massages, she stopped going to  Dr. Roxine Caddy during the pandemic.She states has pain goes down from left lower back to posterior thigh. Pain can be from zero.  She stopped taking Lyrica two weeks ago, she wants to keep it for now and try to wean self off    Anemia found on labs done last year, she states cycles are heavy for 3 days but lasts 7 days, taking iron, seen by Dr. Grayland Ormond and had two infusions. LMP 005/2022, but skipped 11 months prior to that  She had a negative EGD and colonoscopy in 2020.   GERD:  symptoms are intermittent, she states she is feeling better, she has a hiatal hernia but no problems    Vitamin D def: she takes vitamin D , she is taking a rx vitamin D every other week.   Patient Active Problem List   Diagnosis Date Noted   Special screening for malignant neoplasms, colon    Rectal polyp    Diverticulosis of large intestine without diverticulitis    Hiatal hernia    Iron deficiency anemia    Pre-diabetes 10/13/2017   Intermittent low back pain 10/13/2017   History of gestational diabetes 10/13/2017   Hyperlipidemia 05/08/2016   Essential hypertension 12/20/2014   Gastro-esophageal reflux disease without esophagitis 01/25/2010    Past Surgical History:  Procedure Laterality Date   CESAREAN SECTION     COLONOSCOPY WITH PROPOFOL N/A 07/08/2018   Procedure: COLONOSCOPY WITH PROPOFOL;  Surgeon: Virgel Manifold, MD;  Location: [facility] ENDOSCOPY;  Service: Endoscopy;  Laterality: N/A;   ESOPHAGOGASTRODUODENOSCOPY (EGD) WITH PROPOFOL N/A 07/08/2018   Procedure: ESOPHAGOGASTRODUODENOSCOPY (EGD) WITH PROPOFOL;  Surgeon: Virgel Manifold, MD;  Location: [facility] ENDOSCOPY;  Service: Endoscopy;  Laterality: N/A;   TEAR DUCT PROBING  Family History  Problem Relation Age of Onset   Hypertension Mother    Hyperlipidemia Mother    Hypertension Father    Breast cancer Neg Hx     Social History   Tobacco Use   Smoking status: Never   Smokeless tobacco: Never  Substance Use Topics   Alcohol use: Yes    Alcohol/week: 1.0 standard drink    Types: 1 Glasses of wine per week    Comment: seldom  every 4-5 months     Current Outpatient Medications:    docusate sodium (COLACE) 100 MG capsule, Take 1 capsule (100 mg total) by mouth 2 (two) times daily., Disp: 90 capsule, Rfl: 1   ferrous sulfate 325 (65 FE) MG EC tablet, Take 1 tablet (325 mg total) by mouth 2 (two) times daily., Disp: 180 tablet, Rfl: 1   hydrochlorothiazide (MICROZIDE) 12.5 MG capsule, TAKE 1  CAPSULE (12.5 MG TOTAL) BY MOUTH DAILY., Disp: 30 capsule, Rfl: 0   pregabalin (LYRICA) 50 MG capsule, Take 1 capsule (50 mg total) by mouth at bedtime., Disp: 90 capsule, Rfl: 1   Vitamin D, Ergocalciferol, (DRISDOL) 1.25 MG (50000 UNIT) CAPS capsule, TAKE 1 CAPSULE (50,000 UNITS TOTAL) BY MOUTH EVERY 7 (SEVEN) DAYS., Disp: 12 capsule, Rfl: 1  Allergies  Allergen Reactions   Zantac [Ranitidine Hcl]     I personally reviewed active problem list, medication list, allergies, family history, social history, health maintenance with the patient/caregiver today.   ROS  Constitutional: Negative for fever or weight change.  Respiratory: Negative for cough and shortness of breath.   Cardiovascular: Negative for chest pain or palpitations.  Gastrointestinal: Negative for abdominal pain, no bowel changes.  Musculoskeletal: Negative for gait problem or joint swelling.  Skin: Negative for rash.  Neurological: Negative for dizziness or headache.  No other specific complaints in a complete review of systems (except as listed in HPI above).   Objective  Vitals:   02/13/21 0919  BP: 132/80  Pulse: 100  Resp: 16  Temp: 97.9 F (36.6 C)  SpO2: 97%  Weight: 158 lb (71.7 kg)  Height: '4\' 11"'$  (1.499 m)    Body mass index is 31.91 kg/m.  Physical Exam  Constitutional: Patient appears well-developed and well-nourished. Obese  No distress.  HEENT: head atraumatic, normocephalic, pupils equal and reactive to light,neck supple Cardiovascular: Normal rate, regular rhythm and normal heart sounds.  No murmur heard. No BLE edema. Pulmonary/Chest: Effort normal and breath sounds normal. No respiratory distress. Abdominal: Soft.  There is no tenderness. Psychiatric: Patient has a normal mood and affect. behavior is normal. Judgment and thought content normal.    PHQ2/9: Depression screen [facility] 2/9 02/13/2021 01/08/2021 08/12/2020 02/13/2020 09/20/2019  Decreased Interest 0 0 0 0 0  Down, Depressed, Hopeless  0 0 0 0 0  PHQ - 2 Score 0 0 0 0 0  Altered sleeping - - - - 0  Tired, decreased energy - - - - 0  Change in appetite - - - - 0  Feeling bad or failure about yourself  - - - - 0  Trouble concentrating - - - - 0  Moving slowly or fidgety/restless - - - - 0  Suicidal thoughts - - - - 0  PHQ-9 Score - - - - 0  Difficult doing work/chores - - - - -    phq 9 is negative   Fall Risk: Fall Risk  02/13/2021 01/08/2021 08/12/2020 02/13/2020 09/07/2019  Falls in the past year? 0 0 0  0 0  Number falls in past yr: 0 0 0 0 0  Injury with Fall? 0 0 0 0 0  Risk for fall due to : No Fall Risks - - - -  Follow up Falls prevention discussed - - - -      Functional Status Survey: Is the patient deaf or have difficulty hearing?: No Does the patient have difficulty seeing, even when wearing glasses/contacts?: No Does the patient have difficulty concentrating, remembering, or making decisions?: No Does the patient have difficulty walking or climbing stairs?: No Does the patient have difficulty dressing or bathing?: No Does the patient have difficulty doing errands alone such as visiting a doctor's office or shopping?: No    Assessment & Plan  1. Pre-diabetes  - metFORMIN (GLUCOPHAGE-XR) 500 MG 24 hr tablet; Take 1 tablet (500 mg total) by mouth daily with breakfast.  Dispense: 90 tablet; Refill: 1  Discussed possible side effects   2. Dyslipidemia   3. Essential hypertension  - hydrochlorothiazide (MICROZIDE) 12.5 MG capsule; Take 1 capsule (12.5 mg total) by mouth daily.  Dispense: 90 capsule; Refill: 1  4. Obesity (BMI 30.0-34.9)   5. Iron deficiency anemia, unspecified iron deficiency anemia type   6. Vitamin D insufficiency   7. History of gestational diabetes   8. Sciatica associated with disorder of lumbar spine   9. Pure hypercholesterolemia   10. Peri-menopausal   11. Hiatal hernia

## 2021-02-13 ENCOUNTER — Other Ambulatory Visit: Payer: Self-pay

## 2021-02-13 ENCOUNTER — Ambulatory Visit: Payer: BC Managed Care – PPO | Admitting: Family Medicine

## 2021-02-13 ENCOUNTER — Other Ambulatory Visit: Payer: Self-pay | Admitting: Family Medicine

## 2021-02-13 ENCOUNTER — Encounter: Payer: Self-pay | Admitting: Family Medicine

## 2021-02-13 VITALS — BP 132/80 | HR 100 | Temp 97.9°F | Resp 16 | Ht 59.0 in | Wt 158.0 lb

## 2021-02-13 DIAGNOSIS — K449 Diaphragmatic hernia without obstruction or gangrene: Secondary | ICD-10-CM

## 2021-02-13 DIAGNOSIS — M5386 Other specified dorsopathies, lumbar region: Secondary | ICD-10-CM

## 2021-02-13 DIAGNOSIS — I10 Essential (primary) hypertension: Secondary | ICD-10-CM

## 2021-02-13 DIAGNOSIS — E559 Vitamin D deficiency, unspecified: Secondary | ICD-10-CM

## 2021-02-13 DIAGNOSIS — E785 Hyperlipidemia, unspecified: Secondary | ICD-10-CM

## 2021-02-13 DIAGNOSIS — R7303 Prediabetes: Secondary | ICD-10-CM

## 2021-02-13 DIAGNOSIS — E669 Obesity, unspecified: Secondary | ICD-10-CM | POA: Diagnosis not present

## 2021-02-13 DIAGNOSIS — D509 Iron deficiency anemia, unspecified: Secondary | ICD-10-CM

## 2021-02-13 DIAGNOSIS — E7800 Pure hypercholesterolemia, unspecified: Secondary | ICD-10-CM

## 2021-02-13 DIAGNOSIS — Z8632 Personal history of gestational diabetes: Secondary | ICD-10-CM

## 2021-02-13 DIAGNOSIS — N951 Menopausal and female climacteric states: Secondary | ICD-10-CM

## 2021-02-13 MED ORDER — METFORMIN HCL ER 500 MG PO TB24: 500.0000 mg | ORAL_TABLET | Freq: Every day | ORAL | 1 refills | Status: DC

## 2021-02-13 MED ORDER — HYDROCHLOROTHIAZIDE 12.5 MG PO CAPS: 12.5000 mg | ORAL_CAPSULE | Freq: Every day | ORAL | 1 refills | Status: DC

## 2021-02-13 NOTE — Telephone Encounter (Signed)
Requested medication (s) are due for refill today - yes  Requested medication (s) are on the active medication list -yes  Future visit scheduled -yes  Last refill: 08/12/20 #90 1RF  Notes to clinic: Request RF: non delegated Rx  Requested Prescriptions  Pending Prescriptions Disp Refills   pregabalin (LYRICA) 50 MG capsule [Pharmacy Med Name: Pregabalin '50MG'$  CAPS] 30 capsule     Sig: Take 1 capsule (50 mg total) by mouth at bedtime.     Not Delegated - Neurology:  Anticonvulsants - Controlled Failed - 02/13/2021 11:52 AM      Failed - This refill cannot be delegated      Passed - Valid encounter within last 12 months    Recent Outpatient Visits           Today [facility] Steele Sizer, MD   1 month ago Well adult exam   [facility] Steele Sizer, MD   6 months ago Pre-diabetes   [facility] Steele Sizer, MD   1 year ago Pre-diabetes   [facility] Steele Sizer, MD   1 year ago Well adult exam   [facility] Steele Sizer, MD       Future Appointments             In 6 months Steele Sizer, MD [facility], [facility]               Requested Prescriptions  Pending Prescriptions Disp Refills   pregabalin (LYRICA) 50 MG capsule [Pharmacy Med Name: Pregabalin '50MG'$  CAPS] 30 capsule     Sig: Take 1 capsule (50 mg total) by mouth at bedtime.     Not Delegated - Neurology:  Anticonvulsants - Controlled Failed - 02/13/2021 11:52 AM      Failed - This refill cannot be delegated      Passed - Valid encounter within last 12 months    Recent Outpatient Visits           Today [facility] Steele Sizer, MD   1 month ago Well adult exam   [facility] Steele Sizer, MD   6 months ago Pre-diabetes   [facility] Steele Sizer, MD   1 year ago Pre-diabetes    [facility] Steele Sizer, MD   1 year ago Well adult exam   [facility] Steele Sizer, MD       Future Appointments             In 6 months Steele Sizer, MD [facility]

## 2021-03-10 ENCOUNTER — Telehealth: Payer: Self-pay

## 2021-03-10 ENCOUNTER — Ambulatory Visit (INDEPENDENT_AMBULATORY_CARE_PROVIDER_SITE_OTHER): Payer: BC Managed Care – PPO

## 2021-03-10 ENCOUNTER — Other Ambulatory Visit: Payer: Self-pay

## 2021-03-10 DIAGNOSIS — Z23 Encounter for immunization: Secondary | ICD-10-CM | POA: Diagnosis not present

## 2021-03-10 NOTE — Telephone Encounter (Signed)
done

## 2021-03-10 NOTE — Telephone Encounter (Signed)
Pt called in and wants to know if a letter stating she got her flu shot today to be uploaded in her mychart. She gets 4 hours of wellness time for getting her flu shot.

## 2021-03-18 ENCOUNTER — Other Ambulatory Visit: Payer: Self-pay

## 2021-03-18 ENCOUNTER — Ambulatory Visit (INDEPENDENT_AMBULATORY_CARE_PROVIDER_SITE_OTHER): Payer: BC Managed Care – PPO

## 2021-03-18 DIAGNOSIS — Z23 Encounter for immunization: Secondary | ICD-10-CM

## 2021-03-18 NOTE — Progress Notes (Signed)
Patient reported no reaction to the first shot in the series. Patient had no immediate adverse reaction noted after 2nd dosage given at this time. She was pleasant and verbalized she would contact us with any questions or concerns.

## 2021-04-17 ENCOUNTER — Encounter: Payer: Self-pay | Admitting: Oncology

## 2021-04-23 ENCOUNTER — Encounter: Payer: Self-pay | Admitting: Oncology

## 2021-04-24 ENCOUNTER — Other Ambulatory Visit: Payer: Self-pay

## 2021-04-24 ENCOUNTER — Ambulatory Visit
Admission: RE | Admit: 2021-04-24 | Discharge: 2021-04-24 | Disposition: A | Discharge status: Home / Self Care | Payer: BC Managed Care – PPO | Source: Ambulatory Visit | Attending: Family Medicine | Admitting: Family Medicine

## 2021-04-24 DIAGNOSIS — Z1231 Encounter for screening mammogram for malignant neoplasm of breast: Secondary | ICD-10-CM | POA: Insufficient documentation

## 2021-05-06 ENCOUNTER — Telehealth: Payer: Self-pay

## 2021-05-06 NOTE — Telephone Encounter (Signed)
Spoke with Rocklin and answered her questions and provided comfort and encouragement to her and her father, Mr Marlyn Corporal.

## 2021-05-06 NOTE — Telephone Encounter (Signed)
Copied from [place] [phone]. Topic: General - Other >> May 06, 2021 12:09 PM Leward Quan A wrote: Reason for CRM: Patient request a call back from a nurse with a general medication question. Would like a call back at Ph# [phone]

## 2021-08-12 NOTE — Progress Notes (Signed)
Name: [name] [name]   MRN: [id number]    DOB: 07-29-1970   Date:08/13/2021       Progress Note  Subjective  Chief Complaint  Follow Up  HPI  Pre-diabetes: there is a family history of DM and she has a personal  history of gestational diabetes, denies polyphagia, polydipsia or polyuria. Last hgbA1C was up to 6.1 %, she started Metformin 500 mg ER last Summer and is doing well, weight is down 1 lbs but she has noticed it curbs her appetite without any side effects   Obesity: she is mindful about her diet, she is drinking soda very seldom, she states she struggles with food since she did not have a lot of food growing up, she is cutting down on calories - eating more balanced meals .Metformin is also helping    HTN: she is compliant with medication,only on HCTZ now, bp is at goal, no chest pain, palpitation or sob. She needs to increase regular physical activity    Dyslipidemia: discussed lipid panel , there is a family history of heart disease but in males in the late 60's, we will monitor for now . Discussed importance healthy diet and physical activity    The 10-year ASCVD risk score (Arnett DK, et al., 2019) is: 5.2%   Values used to calculate the score:     Age: 51 years     Sex: Female     Is Non-Hispanic African American: Yes     Diabetic: No     Tobacco smoker: No     Systolic Blood Pressure: 854 mmHg     Is BP treated: Yes     HDL Cholesterol: 52 mg/dL     Total Cholesterol: 233 mg/dL    Chronic low back pain: she is still getting massages and is going to [facility] [place] , she stopped going to  Dr. Chief Operating Officer during the pandemic.She states has pain goes down from left lower back to posterior thigh, but now mostly sciatic notch . Pain is worse in am's average 5/10, later in the day pain can go to zero    She stopped taking Lyrica because it caused sedation Discussed swimming or elliptical since walking causes pain    Anemia found on labs done last year,  she states cycles are heavy for 3 days but lasts 7 days, taking iron, seen by Dr. Grayland Ormond and had two infusions. LMP 10/2020, but skipped 11 months prior to that  She had a negative EGD and colonoscopy in 2020. Last hemoglobin was back to normal   GERD: symptoms are intermittent, she states she is feeling better, she has a hiatal hernia but no problems Doing well at this time   Vitamin D def: she takes vitamin D , she is taking a rx vitamin D every other week.   Patient Active Problem List   Diagnosis Date Noted   Rectal polyp    Diverticulosis of large intestine without diverticulitis    Hiatal hernia    Iron deficiency anemia    Pre-diabetes 10/13/2017   Intermittent low back pain 10/13/2017   History of gestational diabetes 10/13/2017   Hyperlipidemia 05/08/2016   Essential hypertension 12/20/2014   Gastro-esophageal reflux disease without esophagitis 01/25/2010    Past Surgical History:  Procedure Laterality Date   CESAREAN SECTION     COLONOSCOPY WITH PROPOFOL N/A 07/08/2018   Procedure: COLONOSCOPY WITH PROPOFOL;  Surgeon: Virgel Manifold, MD;  Location: [facility] ENDOSCOPY;  Service: Endoscopy;  Laterality: N/A;  ESOPHAGOGASTRODUODENOSCOPY (EGD) WITH PROPOFOL N/A 07/08/2018   Procedure: ESOPHAGOGASTRODUODENOSCOPY (EGD) WITH PROPOFOL;  Surgeon: Virgel Manifold, MD;  Location: [facility] ENDOSCOPY;  Service: Endoscopy;  Laterality: N/A;   TEAR DUCT PROBING      Family History  Problem Relation Age of Onset   Hypertension Mother    Hyperlipidemia Mother    Hypertension Father    Breast cancer Neg Hx     Social History   Tobacco Use   Smoking status: Never   Smokeless tobacco: Never  Substance Use Topics   Alcohol use: Yes    Alcohol/week: 1.0 standard drink    Types: 1 Glasses of wine per week    Comment: seldom  every 4-5 months     Current Outpatient Medications:    docusate sodium (COLACE) 100 MG capsule, Take 1 capsule (100 mg total) by mouth 2 (two) times  daily., Disp: 90 capsule, Rfl: 1   ferrous sulfate 325 (65 FE) MG EC tablet, Take 1 tablet (325 mg total) by mouth 2 (two) times daily., Disp: 180 tablet, Rfl: 1   hydrochlorothiazide (MICROZIDE) 12.5 MG capsule, Take 1 capsule (12.5 mg total) by mouth daily., Disp: 90 capsule, Rfl: 1   metFORMIN (GLUCOPHAGE-XR) 500 MG 24 hr tablet, Take 1 tablet (500 mg total) by mouth daily with breakfast., Disp: 90 tablet, Rfl: 1   pregabalin (LYRICA) 50 MG capsule, Take 1 capsule (50 mg total) by mouth at bedtime. (Patient not taking: Reported on 08/13/2021), Disp: 90 capsule, Rfl: 1  Allergies  Allergen Reactions   Zantac [Ranitidine Hcl]     I personally reviewed active problem list, medication list, allergies, family history, social history, health maintenance with the patient/caregiver today.   ROS  Constitutional: Negative for fever or weight change.  Respiratory: Negative for cough and shortness of breath.   Cardiovascular: Negative for chest pain or palpitations.  Gastrointestinal: Negative for abdominal pain, no bowel changes.  Musculoskeletal: Negative for gait problem or joint swelling.  Skin: Negative for rash.  Neurological: Negative for dizziness or headache.  No other specific complaints in a complete review of systems (except as listed in HPI above).   Objective  Vitals:   08/13/21 0857  BP: 130/84  Pulse: 91  Resp: 16  SpO2: 100%  Weight: 157 lb (71.2 kg)  Height: 5' (1.524 m)    Body mass index is 30.66 kg/m.  Physical Exam  Constitutional: Patient appears well-developed and well-nourished. Obese  No distress.  HEENT: head atraumatic, normocephalic, pupils equal and reactive to light, neck supple,  Cardiovascular: Normal rate, regular rhythm and normal heart sounds.  No murmur heard. No BLE edema. Pulmonary/Chest: Effort normal and breath sounds normal. No respiratory distress. Abdominal: Soft.  There is no tenderness. Psychiatric: Patient has a normal mood and  affect. behavior is normal. Judgment and thought content normal.   PHQ2/9: Depression screen [facility] 2/9 08/13/2021 02/13/2021 01/08/2021 08/12/2020 02/13/2020  Decreased Interest 0 0 0 0 0  Down, Depressed, Hopeless 0 0 0 0 0  PHQ - 2 Score 0 0 0 0 0  Altered sleeping 0 - - - -  Tired, decreased energy 0 - - - -  Change in appetite 0 - - - -  Feeling bad or failure about yourself  0 - - - -  Trouble concentrating 0 - - - -  Moving slowly or fidgety/restless 0 - - - -  Suicidal thoughts 0 - - - -  PHQ-9 Score 0 - - - -  Difficult  doing work/chores - - - - -  Some recent data might be hidden    phq 9 is negative   Fall Risk: Fall Risk  08/13/2021 02/13/2021 01/08/2021 08/12/2020 02/13/2020  Falls in the past year? 0 0 0 0 0  Number falls in past yr: 0 0 0 0 0  Injury with Fall? 0 0 0 0 0  Risk for fall due to : No Fall Risks No Fall Risks - - -  Follow up Falls prevention discussed Falls prevention discussed - - -      Functional Status Survey: Is the patient deaf or have difficulty hearing?: No Does the patient have difficulty seeing, even when wearing glasses/contacts?: No Does the patient have difficulty concentrating, remembering, or making decisions?: No Does the patient have difficulty walking or climbing stairs?: No Does the patient have difficulty dressing or bathing?: No Does the patient have difficulty doing errands alone such as visiting a doctor's office or shopping?: No    Assessment & Plan  1. Essential hypertension  - hydrochlorothiazide (MICROZIDE) 12.5 MG capsule; Take 1 capsule (12.5 mg total) by mouth daily.  Dispense: 90 capsule; Refill: 1  2. Pre-diabetes  - metFORMIN (GLUCOPHAGE-XR) 500 MG 24 hr tablet; Take 1 tablet (500 mg total) by mouth daily with breakfast.  Dispense: 90 tablet; Refill: 1  3. Dyslipidemia  Discussed dietary changes again   4. Vitamin D insufficiency  - Vitamin D, Ergocalciferol, (DRISDOL) 1.25 MG (50000 UNIT) CAPS capsule; Take 1 capsule  (50,000 Units total) by mouth every 14 (fourteen) days.  Dispense: 6 capsule; Refill: 1  5. History of gestational diabetes   6. Peri-menopausal   7. Obesity (BMI 30.0-34.9)  Discussed with the patient the risk posed by an increased BMI. Discussed importance of portion control, calorie counting and at least 150 minutes of physical activity weekly. Avoid sweet beverages and drink more water. Eat at least 6 servings of fruit and vegetables daily    8. Iron deficiency anemia due to chronic blood loss

## 2021-08-13 ENCOUNTER — Ambulatory Visit: Payer: BC Managed Care – PPO | Admitting: Family Medicine

## 2021-08-13 ENCOUNTER — Other Ambulatory Visit: Payer: Self-pay | Admitting: Family Medicine

## 2021-08-13 ENCOUNTER — Encounter: Payer: Self-pay | Admitting: Family Medicine

## 2021-08-13 VITALS — BP 130/84 | HR 91 | Resp 16 | Ht 60.0 in | Wt 157.0 lb

## 2021-08-13 DIAGNOSIS — R7303 Prediabetes: Secondary | ICD-10-CM | POA: Diagnosis not present

## 2021-08-13 DIAGNOSIS — E559 Vitamin D deficiency, unspecified: Secondary | ICD-10-CM | POA: Diagnosis not present

## 2021-08-13 DIAGNOSIS — D500 Iron deficiency anemia secondary to blood loss (chronic): Secondary | ICD-10-CM

## 2021-08-13 DIAGNOSIS — E785 Hyperlipidemia, unspecified: Secondary | ICD-10-CM | POA: Diagnosis not present

## 2021-08-13 DIAGNOSIS — D509 Iron deficiency anemia, unspecified: Secondary | ICD-10-CM

## 2021-08-13 DIAGNOSIS — E669 Obesity, unspecified: Secondary | ICD-10-CM

## 2021-08-13 DIAGNOSIS — N951 Menopausal and female climacteric states: Secondary | ICD-10-CM

## 2021-08-13 DIAGNOSIS — I10 Essential (primary) hypertension: Secondary | ICD-10-CM

## 2021-08-13 DIAGNOSIS — Z8632 Personal history of gestational diabetes: Secondary | ICD-10-CM

## 2021-08-13 MED ORDER — METFORMIN HCL ER 500 MG PO TB24: 500.0000 mg | ORAL_TABLET | Freq: Every day | ORAL | 1 refills | Status: DC

## 2021-08-13 MED ORDER — HYDROCHLOROTHIAZIDE 12.5 MG PO CAPS: 12.5000 mg | ORAL_CAPSULE | Freq: Every day | ORAL | 1 refills | Status: DC

## 2021-08-13 MED ORDER — VITAMIN D (ERGOCALCIFEROL) 1.25 MG (50000 UNIT) PO CAPS: 50000.0000 [IU] | ORAL_CAPSULE | ORAL | 1 refills | Status: DC

## 2021-12-31 ENCOUNTER — Other Ambulatory Visit: Payer: Self-pay | Admitting: Family Medicine

## 2021-12-31 DIAGNOSIS — D509 Iron deficiency anemia, unspecified: Secondary | ICD-10-CM

## 2022-01-13 ENCOUNTER — Ambulatory Visit (INDEPENDENT_AMBULATORY_CARE_PROVIDER_SITE_OTHER): Payer: BC Managed Care – PPO | Admitting: Family Medicine

## 2022-01-13 ENCOUNTER — Encounter: Payer: Self-pay | Admitting: Family Medicine

## 2022-01-13 VITALS — BP 132/82 | HR 91 | Resp 16 | Ht 60.0 in | Wt 155.0 lb

## 2022-01-13 DIAGNOSIS — Z862 Personal history of diseases of the blood and blood-forming organs and certain disorders involving the immune mechanism: Secondary | ICD-10-CM | POA: Diagnosis not present

## 2022-01-13 DIAGNOSIS — Z0000 Encounter for general adult medical examination without abnormal findings: Secondary | ICD-10-CM

## 2022-01-13 DIAGNOSIS — E7800 Pure hypercholesterolemia, unspecified: Secondary | ICD-10-CM | POA: Diagnosis not present

## 2022-01-13 DIAGNOSIS — E559 Vitamin D deficiency, unspecified: Secondary | ICD-10-CM | POA: Diagnosis not present

## 2022-01-13 DIAGNOSIS — R7303 Prediabetes: Secondary | ICD-10-CM | POA: Diagnosis not present

## 2022-01-13 DIAGNOSIS — I10 Essential (primary) hypertension: Secondary | ICD-10-CM | POA: Diagnosis not present

## 2022-01-13 DIAGNOSIS — Z1231 Encounter for screening mammogram for malignant neoplasm of breast: Secondary | ICD-10-CM

## 2022-01-13 NOTE — Progress Notes (Signed)
Name: [name] [name]   MRN: [id number]    DOB: 22-Mar-1971   Date:01/13/2022       Progress Note  Subjective  Chief Complaint  Annual Exam  HPI  Patient presents for annual CPE.  Diet: eating healthy  Exercise: going to a personal trainer three days a week, advised to exercise 5 days a week.    [place] Office Visit from 01/08/2021 in [facility]  AUDIT-C Score 0      Depression: Phq 9 is  negative    01/13/2022    9:24 AM 08/13/2021    8:57 AM 02/13/2021    9:19 AM 01/08/2021    8:53 AM 08/12/2020    7:53 AM  Depression screen PHQ 2/9  Decreased Interest 0 0 0 0 0  Down, Depressed, Hopeless 0 0 0 0 0  PHQ - 2 Score 0 0 0 0 0  Altered sleeping 0 0     Tired, decreased energy 0 0     Change in appetite 0 0     Feeling bad or failure about yourself  0 0     Trouble concentrating 0 0     Moving slowly or fidgety/restless 0 0     Suicidal thoughts 0 0     PHQ-9 Score 0 0      Hypertension: BP Readings from Last 3 Encounters:  01/13/22 132/82  08/13/21 130/84  02/13/21 132/80   Obesity: Wt Readings from Last 3 Encounters:  01/13/22 155 lb (70.3 kg)  08/13/21 157 lb (71.2 kg)  02/13/21 158 lb (71.7 kg)   BMI Readings from Last 3 Encounters:  01/13/22 30.27 kg/m  08/13/21 30.66 kg/m  02/13/21 31.91 kg/m     Vaccines:   HPV: N/A Tdap: up to date Shingrix: up to date Pneumonia: N/A Flu: discussed booster  COVID-19: discussed bivalent booster    Hep C Screening: 08/10/18 STD testing and prevention (HIV/chl/gon/syphilis): 10/13/17 Intimate partner violence: negative screen  Sexual History :one partner, no pain  Menstrual History/LMP/Abnormal Bleeding: LMP 10/2020, having hot flashes  Discussed importance of follow up if any post-menopausal bleeding: yes  Incontinence Symptoms: negative for symptoms   Breast cancer:  - Last Mammogram: 04/24/21 - BRCA gene screening: N/A  Osteoporosis Prevention : Discussed high calcium and vitamin D  supplementation, weight bearing exercises Bone density :not applicable   Cervical cancer screening: 08/10/18  Skin cancer: Discussed monitoring for atypical lesions  Colorectal cancer: 07/08/18   Lung cancer:  Low Dose CT Chest recommended if Age 56-80 years, 20 pack-year currently smoking OR have quit w/in 15years. Patient does not qualify for screen   ECG: 10/13/17  Advanced Care Planning: A voluntary discussion about advance care planning including the explanation and discussion of advance directives.  Discussed health care proxy and Living will, and the patient was able to identify a health care proxy as husband .  Patient does not have a living will and power of attorney of health care   Lipids: Lab Results  Component Value Date   CHOL 233 (H) 08/12/2020   CHOL 233 (H) 08/11/2019   CHOL 220 (H) 08/10/2018   Lab Results  Component Value Date   HDL 52 08/12/2020   HDL 49 (L) 08/11/2019   HDL 57 08/10/2018   Lab Results  Component Value Date   LDLCALC 161 (H) 08/12/2020   LDLCALC 160 (H) 08/11/2019   LDLCALC 137 (H) 08/10/2018   Lab Results  Component Value Date   TRIG  95 08/12/2020   TRIG 121 08/11/2019   TRIG 131 08/10/2018   Lab Results  Component Value Date   CHOLHDL 4.5 08/12/2020   CHOLHDL 4.8 08/11/2019   CHOLHDL 3.9 08/10/2018   No results found for: "LDLDIRECT"  Glucose: Glucose, Bld  Date Value Ref Range Status  08/12/2020 96 65 - 99 mg/dL Final    Comment:    .            Fasting reference interval .   08/11/2019 85 65 - 99 mg/dL Final    Comment:    .            Fasting reference interval .   08/10/2018 74 65 - 99 mg/dL Final    Comment:    .            Fasting reference interval .     Patient Active Problem List   Diagnosis Date Noted   Rectal polyp    Diverticulosis of large intestine without diverticulitis    Hiatal hernia    Iron deficiency anemia    Pre-diabetes 10/13/2017   Intermittent low back pain 10/13/2017   History of  gestational diabetes 10/13/2017   Hyperlipidemia 05/08/2016   Essential hypertension 12/20/2014   Gastro-esophageal reflux disease without esophagitis 01/25/2010    Past Surgical History:  Procedure Laterality Date   CESAREAN SECTION     COLONOSCOPY WITH PROPOFOL N/A 07/08/2018   Procedure: COLONOSCOPY WITH PROPOFOL;  Surgeon: Virgel Manifold, MD;  Location: [facility] ENDOSCOPY;  Service: Endoscopy;  Laterality: N/A;   ESOPHAGOGASTRODUODENOSCOPY (EGD) WITH PROPOFOL N/A 07/08/2018   Procedure: ESOPHAGOGASTRODUODENOSCOPY (EGD) WITH PROPOFOL;  Surgeon: Virgel Manifold, MD;  Location: [facility] ENDOSCOPY;  Service: Endoscopy;  Laterality: N/A;   TEAR DUCT PROBING      Family History  Problem Relation Age of Onset   Hypertension Mother    Hyperlipidemia Mother    Hypertension Father    Breast cancer Neg Hx     Social History   Socioeconomic History   Marital status: Married    Spouse name: Ricky    Number of children: 2   Years of education: Not on file   Highest education level: Master's degree (e.g., MA, MS, MEng, MEd, MSW, MBA)  Occupational History   Occupation: mental health Education officer, 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   Tobacco Use   Smoking status: Never   Smokeless tobacco: Never  Vaping Use   Vaping Use: Never used  Substance and Sexual Activity   Alcohol use: Yes    Alcohol/week: 1.0 standard drink of alcohol    Types: 1 Glasses of wine per week    Comment: seldom  every 4-5 months   Drug use: No   Sexual activity: Yes    Partners: Male    Birth control/protection: Surgical  Other Topics Concern   Not on file  Social History Narrative   Two boys, 14 years apart    Social Determinants of Health   Financial Resource Strain: Low Risk  (01/13/2022)   Overall Financial Resource Strain (CARDIA)    Difficulty of Paying Living Expenses: Not hard at all  Food Insecurity: No Food Insecurity (01/13/2022)   Hunger Vital Sign    Worried About Running Out of Food  in the Last Year: Never true    Ran Out of Food in the Last Year: Never true  Transportation Needs: No Transportation Needs (01/13/2022)   PRAPARE - Hydrologist (Medical):  No    Lack of Transportation (Non-Medical): No  Physical Activity: Insufficiently Active (01/13/2022)   Exercise Vital Sign    Days of Exercise per Week: 3 days    Minutes of Exercise per Session: 30 min  Stress: No Stress Concern Present (01/13/2022)   [place]    Feeling of Stress : Not at all  Social Connections: Moderately Integrated (01/13/2022)   Social Connection and Isolation Panel [NHANES]    Frequency of Communication with Friends and Family: More than three times a week    Frequency of Social Gatherings with Friends and Family: Three times a week    Attends Religious Services: More than 4 times per year    Active Member of Clubs or Organizations: No    Attends Archivist Meetings: Never    Marital Status: Married  Human resources officer Violence: Not At Risk (01/13/2022)   Humiliation, Afraid, Rape, and Kick questionnaire    Fear of Current or Ex-Partner: No    Emotionally Abused: No    Physically Abused: No    Sexually Abused: No     Current Outpatient Medications:    docusate sodium (COLACE) 100 MG capsule, TAKE 1 CAPSULE BY MOUTH TWICE A DAY, Disp: 90 capsule, Rfl: 1   ferrous sulfate 325 (65 FE) MG EC tablet, Take 1 tablet (325 mg total) by mouth every other day., Disp: 30 tablet, Rfl: 1   hydrochlorothiazide (MICROZIDE) 12.5 MG capsule, Take 1 capsule (12.5 mg total) by mouth daily., Disp: 90 capsule, Rfl: 1   metFORMIN (GLUCOPHAGE-XR) 500 MG 24 hr tablet, Take 1 tablet (500 mg total) by mouth daily with breakfast., Disp: 90 tablet, Rfl: 1   Vitamin D, Ergocalciferol, (DRISDOL) 1.25 MG (50000 UNIT) CAPS capsule, Take 1 capsule (50,000 Units total) by mouth every 14 (fourteen) days., Disp: 6 capsule, Rfl:  1  Allergies  Allergen Reactions   Zantac [Ranitidine Hcl]      ROS  Constitutional: Negative for fever or weight change.  Respiratory: Negative for cough and shortness of breath.   Cardiovascular: Negative for chest pain or palpitations.  Gastrointestinal: Negative for abdominal pain, no bowel changes.  Musculoskeletal: Negative for gait problem or joint swelling.  Skin: Negative for rash.  Neurological: Negative for dizziness or headache.  No other specific complaints in a complete review of systems (except as listed in HPI above).   Objective  Vitals:   01/13/22 0924  BP: 132/82  Pulse: 91  Resp: 16  SpO2: 98%  Weight: 155 lb (70.3 kg)  Height: 5' (1.524 m)    Body mass index is 30.27 kg/m.  Physical Exam  Constitutional: Patient appears well-developed and well-nourished. No distress.  HENT: Head: Normocephalic and atraumatic. Ears: B TMs ok, no erythema or effusion; Nose: Nose normal. Mouth/Throat: Oropharynx is clear and moist. No oropharyngeal exudate.  Eyes: Conjunctivae and EOM are normal. Pupils are equal, round, and reactive to light. No scleral icterus.  Neck: Normal range of motion. Neck supple. No JVD present. No thyromegaly present.  Cardiovascular: Normal rate, regular rhythm and normal heart sounds.  No murmur heard. No BLE edema. Pulmonary/Chest: Effort normal and breath sounds normal. No respiratory distress. Abdominal: Soft. Bowel sounds are normal, no distension. There is no tenderness. no masses Breast: no lumps or masses, no nipple discharge or rashes FEMALE GENITALIA:  Not done  RECTAL: not done  Musculoskeletal: Normal range of motion, no joint effusions. No gross deformities Neurological: he is alert  and oriented to person, place, and time. No cranial nerve deficit. Coordination, balance, strength, speech and gait are normal.  Skin: Skin is warm and dry. No rash noted. No erythema.  Psychiatric: Patient has a normal mood and affect. behavior  is normal. Judgment and thought content normal.   Fall Risk:    01/13/2022    9:24 AM 08/13/2021    8:57 AM 02/13/2021    9:19 AM 01/08/2021    8:53 AM 08/12/2020    7:53 AM  Fall Risk   Falls in the past year? 0 0 0 0 0  Number falls in past yr: 0 0 0 0 0  Injury with Fall? 0 0 0 0 0  Risk for fall due to : No Fall Risks No Fall Risks No Fall Risks    Follow up Falls prevention discussed Falls prevention discussed Falls prevention discussed       Functional Status Survey: Is the patient deaf or have difficulty hearing?: No Does the patient have difficulty seeing, even when wearing glasses/contacts?: No Does the patient have difficulty concentrating, remembering, or making decisions?: No Does the patient have difficulty walking or climbing stairs?: No Does the patient have difficulty dressing or bathing?: No Does the patient have difficulty doing errands alone such as visiting a doctor's office or shopping?: No   Assessment & Plan  1. Well adult exam  - Lipid panel - CBC with Differential/Platelet - COMPLETE METABOLIC PANEL WITH GFR - Iron, TIBC and Ferritin Panel - Hemoglobin A1c - VITAMIN D 25 Hydroxy (Vit-D Deficiency, Fractures)  2. Vitamin D insufficiency  - VITAMIN D 25 Hydroxy (Vit-D Deficiency, Fractures)  3. Essential hypertension  - COMPLETE METABOLIC PANEL WITH GFR  4. Pre-diabetes  - Hemoglobin A1c  5. Pure hypercholesterolemia  - Lipid panel  6. History of iron deficiency anemia  - CBC with Differential/Platelet - Iron, TIBC and Ferritin Panel  7. Breast cancer screening by mammogram  - MM 3D SCREEN BREAST BILATERAL; Future   -USPSTF grade A and B recommendations reviewed with patient; age-appropriate recommendations, preventive care, screening tests, etc discussed and encouraged; healthy living encouraged; see AVS for patient education given to patient -Discussed importance of 150 minutes of physical activity weekly, eat two servings of fish  weekly, eat one serving of tree nuts ( cashews, pistachios, pecans, almonds.Marland Kitchen) every other day, eat 6 servings of fruit/vegetables daily and drink plenty of water and avoid sweet beverages.   -Reviewed Health Maintenance: Yes.

## 2022-01-13 NOTE — Patient Instructions (Signed)
Preventive Care 40-53 Years Old, Female Preventive care refers to lifestyle choices and visits with your health care provider that can promote health and wellness. Preventive care visits are also called wellness exams. What can I expect for my preventive care visit? Counseling Your health care provider may ask you questions about your: Medical history, including: Past medical problems. Family medical history. Pregnancy history. Current health, including: Menstrual cycle. Method of birth control. Emotional well-being. Home life and relationship well-being. Sexual activity and sexual health. Lifestyle, including: Alcohol, nicotine or tobacco, and drug use. Access to firearms. Diet, exercise, and sleep habits. Work and work environment. Sunscreen use. Safety issues such as seatbelt and bike helmet use. Physical exam Your health care provider will check your: Height and weight. These may be used to calculate your BMI (body mass index). BMI is a measurement that tells if you are at a healthy weight. Waist circumference. This measures the distance around your waistline. This measurement also tells if you are at a healthy weight and may help predict your risk of certain diseases, such as type 2 diabetes and high blood pressure. Heart rate and blood pressure. Body temperature. Skin for abnormal spots. What immunizations do I need?  Vaccines are usually given at various ages, according to a schedule. Your health care provider will recommend vaccines for you based on your age, medical history, and lifestyle or other factors, such as travel or where you work. What tests do I need? Screening Your health care provider may recommend screening tests for certain conditions. This may include: Lipid and cholesterol levels. Diabetes screening. This is done by checking your blood sugar (glucose) after you have not eaten for a while (fasting). Pelvic exam and Pap test. Hepatitis B test. Hepatitis C  test. HIV (human immunodeficiency virus) test. STI (sexually transmitted infection) testing, if you are at risk. Lung cancer screening. Colorectal cancer screening. Mammogram. Talk with your health care provider about when you should start having regular mammograms. This may depend on whether you have a family history of breast cancer. BRCA-related cancer screening. This may be done if you have a family history of breast, ovarian, tubal, or peritoneal cancers. Bone density scan. This is done to screen for osteoporosis. Talk with your health care provider about your test results, treatment options, and if necessary, the need for more tests. Follow these instructions at home: Eating and drinking  Eat a diet that includes fresh fruits and vegetables, whole grains, lean protein, and low-fat dairy products. Take vitamin and mineral supplements as recommended by your health care provider. Do not drink alcohol if: Your health care provider tells you not to drink. You are pregnant, may be pregnant, or are planning to become pregnant. If you drink alcohol: Limit how much you have to 0-1 drink a day. Know how much alcohol is in your drink. In the [place] one drink equals one 12 oz bottle of beer (355 mL), one 5 oz glass of wine (148 mL), or one 1 oz glass of hard liquor (44 mL). Lifestyle Brush your teeth every morning and night with fluoride toothpaste. Floss one time each day. Exercise for at least 30 minutes 5 or more days each week. Do not use any products that contain nicotine or tobacco. These products include cigarettes, chewing tobacco, and vaping devices, such as e-cigarettes. If you need help quitting, ask your health care provider. Do not use drugs. If you are sexually active, practice safe sex. Use a condom or other form of protection to   prevent STIs. If you do not wish to become pregnant, use a form of birth control. If you plan to become pregnant, see your health care provider for a  prepregnancy visit. Take aspirin only as told by your health care provider. Make sure that you understand how much to take and what form to take. Work with your health care provider to find out whether it is safe and beneficial for you to take aspirin daily. Find healthy ways to manage stress, such as: Meditation, yoga, or listening to music. Journaling. Talking to a trusted person. Spending time with friends and family. Minimize exposure to UV radiation to reduce your risk of skin cancer. Safety Always wear your seat belt while driving or riding in a vehicle. Do not drive: If you have been drinking alcohol. Do not ride with someone who has been drinking. When you are tired or distracted. While texting. If you have been using any mind-altering substances or drugs. Wear a helmet and other protective equipment during sports activities. If you have firearms in your house, make sure you follow all gun safety procedures. Seek help if you have been physically or sexually abused. What's next? Visit your health care provider once a year for an annual wellness visit. Ask your health care provider how often you should have your eyes and teeth checked. Stay up to date on all vaccines. This information is not intended to replace advice given to you by your health care provider. Make sure you discuss any questions you have with your health care provider. Document Revised: 11/20/2020 Document Reviewed: 11/20/2020 Elsevier Patient Education  [place].

## 2022-01-14 LAB — CBC WITH DIFFERENTIAL/PLATELET
Absolute Monocytes: 541 cells/uL (ref 200–950)
Basophils Absolute: 41 cells/uL (ref 0–200)
Basophils Relative: 0.5 %
Eosinophils Absolute: 156 cells/uL (ref 15–500)
Eosinophils Relative: 1.9 %
HCT: 40.9 % (ref 35.0–45.0)
Hemoglobin: 14 g/dL (ref 11.7–15.5)
Lymphs Abs: 1443 cells/uL (ref 850–3900)
MCH: 29.4 pg (ref 27.0–33.0)
MCHC: 34.2 g/dL (ref 32.0–36.0)
MCV: 85.9 fL (ref 80.0–100.0)
MPV: 10.7 fL (ref 7.5–12.5)
Monocytes Relative: 6.6 %
Neutro Abs: 6019 cells/uL (ref 1500–7800)
Neutrophils Relative %: 73.4 %
Platelets: 406 10*3/uL — ABNORMAL HIGH (ref 140–400)
RBC: 4.76 10*6/uL (ref 3.80–5.10)
RDW: 13.2 % (ref 11.0–15.0)
Total Lymphocyte: 17.6 %
WBC: 8.2 10*3/uL (ref 3.8–10.8)

## 2022-01-14 LAB — COMPLETE METABOLIC PANEL WITH GFR
AG Ratio: 1.8 (calc) (ref 1.0–2.5)
ALT: 25 U/L (ref 6–29)
AST: 24 U/L (ref 10–35)
Albumin: 4.9 g/dL (ref 3.6–5.1)
Alkaline phosphatase (APISO): 69 U/L (ref 37–153)
BUN: 12 mg/dL (ref 7–25)
CO2: 30 mmol/L (ref 20–32)
Calcium: 10.4 mg/dL (ref 8.6–10.4)
Chloride: 102 mmol/L (ref 98–110)
Creat: 0.94 mg/dL (ref 0.50–1.03)
Globulin: 2.7 g/dL (calc) (ref 1.9–3.7)
Glucose, Bld: 91 mg/dL (ref 65–99)
Potassium: 4 mmol/L (ref 3.5–5.3)
Sodium: 141 mmol/L (ref 135–146)
Total Bilirubin: 0.3 mg/dL (ref 0.2–1.2)
Total Protein: 7.6 g/dL (ref 6.1–8.1)
eGFR: 73 mL/min/{1.73_m2} (ref >=60)

## 2022-01-14 LAB — LIPID PANEL
Cholesterol: 245 mg/dL — ABNORMAL HIGH (ref <200)
HDL: 51 mg/dL (ref >=50)
LDL Cholesterol (Calc): 165 mg/dL (calc) — ABNORMAL HIGH
Non-HDL Cholesterol (Calc): 194 mg/dL (calc) — ABNORMAL HIGH (ref <130)
Total CHOL/HDL Ratio: 4.8 (calc) (ref <5.0)
Triglycerides: 144 mg/dL (ref <150)

## 2022-01-14 LAB — IRON,TIBC AND FERRITIN PANEL
%SAT: 15 % (calc) — ABNORMAL LOW (ref 16–45)
Ferritin: 36 ng/mL (ref 16–232)
Iron: 60 ug/dL (ref 45–160)
TIBC: 413 mcg/dL (calc) (ref 250–450)

## 2022-01-14 LAB — HEMOGLOBIN A1C
Hgb A1c MFr Bld: 6.1 % of total Hgb — ABNORMAL HIGH (ref <5.7)
Mean Plasma Glucose: 128 mg/dL
eAG (mmol/L): 7.1 mmol/L

## 2022-01-14 LAB — VITAMIN D 25 HYDROXY (VIT D DEFICIENCY, FRACTURES): Vit D, 25-Hydroxy: 61 ng/mL (ref 30–100)

## 2022-02-13 ENCOUNTER — Other Ambulatory Visit: Payer: Self-pay | Admitting: Family Medicine

## 2022-02-13 ENCOUNTER — Ambulatory Visit: Payer: BC Managed Care – PPO | Admitting: Family Medicine

## 2022-02-13 ENCOUNTER — Telehealth: Payer: Self-pay | Admitting: Family Medicine

## 2022-02-13 ENCOUNTER — Encounter: Payer: Self-pay | Admitting: Family Medicine

## 2022-02-13 DIAGNOSIS — R7303 Prediabetes: Secondary | ICD-10-CM

## 2022-02-13 MED ORDER — METFORMIN HCL ER 500 MG PO TB24: 500.0000 mg | ORAL_TABLET | Freq: Every day | ORAL | 0 refills | Status: DC

## 2022-02-13 NOTE — Telephone Encounter (Signed)
Patient would like a follow up call regarding why her metFORMIN (GLUCOPHAGE-XR) 500 MG 24 hr tablet was denied. Patient states her last cpe was 01/13/2022. Patient would like to pick her medication up today because she will run out tomorrow.     [address] [facility] - [place], [place] [address] Phone:  [phone]  Fax:  [phone]

## 2022-02-13 NOTE — Telephone Encounter (Signed)
Already received same message through patient advise request and forwarded to Dr. Ancil Boozer.

## 2022-02-19 NOTE — Progress Notes (Unsigned)
Name: [name] [name]   MRN: [id number]    DOB: [birth year]   Date:02/20/2022       Progress Note  Subjective  Chief Complaint  Medication Refill  HPI  Pre-diabetes: there is a family history of DM and she has a personal  history of gestational diabetes, denies polyphagia, polydipsia or polyuria. A1C has been stable at 6.1 %  She started Metformin 500 mg ER last Summer 22 and tolerates it well, but is willing to try a higher dose today. Reviewed a low carb diet with patient  and she has increased physical activity   Obesity: she is mindful about her diet, and increased physical activity over the last month. Her weight has been stable over the past year    HTN: she is compliant with medication,only on HCTZ , bp is at goal, no chest pain, palpitation or sob. She has increased her level of physical activity    Dyslipidemia: discussed lipid panel , there is a family history of heart disease but in males in the late 60's, we will monitor for now . Discussed importance healthy diet and physical activity    The 10-year ASCVD risk score (Arnett DK, et al., 2019) is: 4.4%   Values used to calculate the score:     Age: 50 years     Sex: Female     Is Non-Hispanic African American: Yes     Diabetic: No     Tobacco smoker: No     Systolic Blood Pressure: 759 mmHg     Is BP treated: Yes     HDL Cholesterol: 51 mg/dL     Total Cholesterol: 245 mg/dL    GERD: symptoms are intermittent, she states she is feeling better, she has a hiatal hernia but no problems Unchanged    Vitamin D def:she is taking a rx vitamin D every other week., last level at goal    Patient Active Problem List   Diagnosis Date Noted   Rectal polyp    Diverticulosis of large intestine without diverticulitis    Hiatal hernia    History of iron deficiency anemia    Pre-diabetes 10/13/2017   Intermittent low back pain 10/13/2017   History of gestational diabetes 10/13/2017   Hyperlipidemia 05/08/2016   Essential  hypertension 12/20/2014   Gastro-esophageal reflux disease without esophagitis 01/25/2010    Past Surgical History:  Procedure Laterality Date   CESAREAN SECTION     COLONOSCOPY WITH PROPOFOL N/A 07/08/2018   Procedure: COLONOSCOPY WITH PROPOFOL;  Surgeon: Virgel Manifold, MD;  Location: [facility];  Service: Endoscopy;  Laterality: N/A;   ESOPHAGOGASTRODUODENOSCOPY (EGD) WITH PROPOFOL N/A 07/08/2018   Procedure: ESOPHAGOGASTRODUODENOSCOPY (EGD) WITH PROPOFOL;  Surgeon: Virgel Manifold, MD;  Location: [facility] END[facility]COPY;  Service: Endoscopy;  Laterality: N/A;   TEAR DUCT PROBING      Family History  Problem Relation Age of Onset   Hypertension Mother    Hyperlipidemia Mother    Hypertension Father    Breast cancer Neg Hx     Social History   Tobacco Use   Smoking status: Never   Smokeless tobacco: Never  Substance Use Topics   Alcohol use: Yes    Alcohol/week: 1.0 standard drink of alcohol    Types: 1 Glasses of wine per week    Comment: seldom  every 4-5 months     Current Outpatient Medications:    docusate sodium (COLACE) 100 MG capsule, TAKE 1 CAPSULE BY MOUTH TWICE A DAY, Disp: 90 capsule,  Rfl: 1   ferrous sulfate 325 (65 FE) MG EC tablet, Take 1 tablet (325 mg total) by mouth every other day., Disp: 30 tablet, Rfl: 1   metFORMIN (GLUCOPHAGE-XR) 750 MG 24 hr tablet, Take 1 tablet (750 mg total) by mouth daily with breakfast., Disp: 90 tablet, Rfl: 1   hydrochlorothiazide (MICROZIDE) 12.5 MG capsule, Take 1 capsule (12.5 mg total) by mouth daily., Disp: 90 capsule, Rfl: 1   Vitamin D, Ergocalciferol, (DRISDOL) 1.25 MG (50000 UNIT) CAPS capsule, Take 1 capsule (50,000 Units total) by mouth every 14 (fourteen) days., Disp: 6 capsule, Rfl: 1  Allergies  Allergen Reactions   Zantac [Ranitidine Hcl]     I personally reviewed active problem list, medication list, allergies, family history, social history, health maintenance with the patient/caregiver  today.   ROS  Constitutional: Negative for fever or weight change.  Respiratory: Negative for cough and shortness of breath.   Cardiovascular: Negative for chest pain or palpitations.  Gastrointestinal: Negative for abdominal pain, no bowel changes.  Musculoskeletal: Negative for gait problem or joint swelling.  Skin: Negative for rash.  Neurological: Negative for dizziness or headache.  No other specific complaints in a complete review of systems (except as listed in HPI above).   Objective  Vitals:   02/20/22 0901 02/20/22 0933  BP: 122/78   Pulse: (!) 105 90  Resp: 16   Temp: 98.1 F (36.7 C)   SpO2: 98%   Weight: 155 lb (70.3 kg)   Height: 5' (1.524 m)     Body mass index is 30.27 kg/m.  Physical Exam  Constitutional: Patient appears well-developed and well-nourished. Obese  No distress.  HEENT: head atraumatic, normocephalic, pupils equal and reactive to light, neck supple Cardiovascular: Normal rate, regular rhythm and normal heart sounds.  No murmur heard. No BLE edema. Pulmonary/Chest: Effort normal and breath sounds normal. No respiratory distress. Abdominal: Soft.  There is no tenderness. Psychiatric: Patient has a normal mood and affect. behavior is normal. Judgment and thought content normal.   Recent Results (from the past 2160 hour(s))  Lipid panel     Status: Abnormal   Collection Time: 01/13/22 10:11 AM  Result Value Ref Range   Cholesterol 245 (H) <200 mg/dL   HDL 51 > OR = 50 mg/dL   Triglycerides 144 <150 mg/dL   LDL Cholesterol (Calc) 165 (H) mg/dL (calc)    Comment: Reference range: <100 . Desirable range <100 mg/dL for primary prevention;   <70 mg/dL for patients with CHD or diabetic patients  with > or = 2 CHD risk factors. Marland Kitchen LDL-C is now calculated using the Martin-Hopkins  calculation, which is a validated novel method providing  better accuracy than the Friedewald equation in the  estimation of LDL-C.  Cresenciano Genre et al. Annamaria Helling.  7341;937(90): 2061-2068  (http://education.[facility].com/faq/FAQ164)    Total CHOL/HDL Ratio 4.8 <5.0 (calc)   Non-HDL Cholesterol (Calc) 194 (H) <130 mg/dL (calc)    Comment: For patients with diabetes plus 1 major ASCVD risk  factor, treating to a non-HDL-C goal of <100 mg/dL  (LDL-C of <70 mg/dL) is considered a therapeutic  option.   CBC with Differential/Platelet     Status: Abnormal   Collection Time: 01/13/22 10:11 AM  Result Value Ref Range   WBC 8.2 3.8 - 10.8 Thousand/uL   RBC 4.76 3.80 - 5.10 Million/uL   Hemoglobin 14.0 11.7 - 15.5 g/dL   HCT 40.9 35.0 - 45.0 %   MCV 85.9 80.0 - 100.0 fL  MCH 29.4 27.0 - 33.0 pg   MCHC 34.2 32.0 - 36.0 g/dL   RDW 13.2 11.0 - 15.0 %   Platelets 406 (H) 140 - 400 Thousand/uL   MPV 10.7 7.5 - 12.5 fL   Neutro Abs 6,019 1,500 - 7,800 cells/uL   Lymphs Abs 1,443 850 - 3,900 cells/uL   Absolute Monocytes 541 200 - 950 cells/uL   Eosinophils Absolute 156 15 - 500 cells/uL   Basophils Absolute 41 0 - 200 cells/uL   Neutrophils Relative % 73.4 %   Total Lymphocyte 17.6 %   Monocytes Relative 6.6 %   Eosinophils Relative 1.9 %   Basophils Relative 0.5 %  COMPLETE METABOLIC PANEL WITH GFR     Status: None   Collection Time: 01/13/22 10:11 AM  Result Value Ref Range   Glucose, Bld 91 65 - 99 mg/dL    Comment: .            Fasting reference interval .    BUN 12 7 - 25 mg/dL   Creat 0.94 0.50 - 1.03 mg/dL   eGFR 73 > OR = 60 mL/min/1.105m2   BUN/Creatinine Ratio SEE NOTE: 6 - 22 (calc)    Comment:    Not Reported: BUN and Creatinine are within    reference range. .    Sodium 141 135 - 146 mmol/L   Potassium 4.0 3.5 - 5.3 mmol/L   Chloride 102 98 - 110 mmol/L   CO2 30 20 - 32 mmol/L   Calcium 10.4 8.6 - 10.4 mg/dL   Total Protein 7.6 6.1 - 8.1 g/dL   Albumin 4.9 3.6 - 5.1 g/dL   Globulin 2.7 1.9 - 3.7 g/dL (calc)   AG Ratio 1.8 1.0 - 2.5 (calc)   Total Bilirubin 0.3 0.2 - 1.2 mg/dL   Alkaline phosphatase (APISO) 69 37 -  153 U/L   AST 24 10 - 35 U/L   ALT 25 6 - 29 U/L  Iron, TIBC and Ferritin Panel     Status: Abnormal   Collection Time: 01/13/22 10:11 AM  Result Value Ref Range   Iron 60 45 - 160 mcg/dL   TIBC 413 250 - 450 mcg/dL (calc)   %SAT 15 (L) 16 - 45 % (calc)   Ferritin 36 16 - 232 ng/mL  Hemoglobin A1c     Status: Abnormal   Collection Time: 01/13/22 10:11 AM  Result Value Ref Range   Hgb A1c MFr Bld 6.1 (H) <5.7 % of total Hgb    Comment: For someone without known diabetes, a hemoglobin  A1c value between 5.7% and 6.4% is consistent with prediabetes and should be confirmed with a  follow-up test. . For someone with known diabetes, a value <7% indicates that their diabetes is well controlled. A1c targets should be individualized based on duration of diabetes, age, comorbid conditions, and other considerations. . This assay result is consistent with an increased risk of diabetes. . Currently, no consensus exists regarding use of hemoglobin A1c for diagnosis of diabetes for children. .    Mean Plasma Glucose 128 mg/dL   eAG (mmol/L) 7.1 mmol/L  VITAMIN D 25 Hydroxy (Vit-D Deficiency, Fractures)     Status: None   Collection Time: 01/13/22 10:11 AM  Result Value Ref Range   Vit D, 25-Hydroxy 61 30 - 100 ng/mL    Comment: Vitamin D Status         25-OH Vitamin D: . Deficiency:                    <  20 ng/mL Insufficiency:             20 - 29 ng/mL Optimal:                 > or = 30 ng/mL . For 25-OH Vitamin D testing on patients on  D2-supplementation and patients for whom quantitation  of D2 and D3 fractions is required, the [facility](TM) 25-OH VIT D, (D2,D3), LC/MS/MS is recommended: order  code [phone] (patients >26yrs). . See Note 1 . Note 1 . For additional information, please refer to  http://education.[facility].com/faq/FAQ199  (This link is being provided for informational/ educational purposes only.)     PHQ2/9:    02/20/2022    9:00 AM 01/13/2022     9:24 AM 08/13/2021    8:57 AM 02/13/2021    9:19 AM 01/08/2021    8:53 AM  Depression screen PHQ 2/9  Decreased Interest 0 0 0 0 0  Down, Depressed, Hopeless 0 0 0 0 0  PHQ - 2 Score 0 0 0 0 0  Altered sleeping 0 0 0    Tired, decreased energy 0 0 0    Change in appetite 0 0 0    Feeling bad or failure about yourself  0 0 0    Trouble concentrating 0 0 0    Moving slowly or fidgety/restless 0 0 0    Suicidal thoughts 0 0 0    PHQ-9 Score 0 0 0      phq 9 is  negative  Fall Risk:    02/20/2022    9:00 AM 01/13/2022    9:24 AM 08/13/2021    8:57 AM 02/13/2021    9:19 AM 01/08/2021    8:53 AM  Fall Risk   Falls in the past year? 0 0 0 0 0  Number falls in past yr: 0 0 0 0 0  Injury with Fall? 0 0 0 0 0  Risk for fall due to : No Fall Risks No Fall Risks No Fall Risks No Fall Risks   Follow up Falls prevention discussed Falls prevention discussed Falls prevention discussed Falls prevention discussed       Functional Status Survey: Is the patient deaf or have difficulty hearing?: No Does the patient have difficulty seeing, even when wearing glasses/contacts?: No Does the patient have difficulty concentrating, remembering, or making decisions?: No Does the patient have difficulty walking or climbing stairs?: No Does the patient have difficulty dressing or bathing?: No Does the patient have difficulty doing errands alone such as visiting a doctor's office or shopping?: No    Assessment & Plan  1. Essential hypertension  - hydrochlorothiazide (MICROZIDE) 12.5 MG capsule; Take 1 capsule (12.5 mg total) by mouth daily.  Dispense: 90 capsule; Refill: 1  2. Vitamin D insufficiency  - Vitamin D, Ergocalciferol, (DRISDOL) 1.25 MG (50000 UNIT) CAPS capsule; Take 1 capsule (50,000 Units total) by mouth every 14 (fourteen) days.  Dispense: 6 capsule; Refill: 1  3. Dyslipidemia   4. Pre-diabetes  - metFORMIN (GLUCOPHAGE-XR) 750 MG 24 hr tablet; Take 1 tablet (750 mg total) by mouth  daily with breakfast.  Dispense: 90 tablet; Refill: 1  5. Need for immunization against influenza  - Flu Vaccine QUAD 6+ mos PF IM (Fluarix Quad PF)  6. Obesity (BMI 30.0-34.9)  Discussed with the patient the risk posed by an increased BMI. Discussed importance of portion control, calorie counting and at least 150 minutes of physical activity weekly. Avoid sweet beverages and drink more  water. Eat at least 6 servings of fruit and vegetables daily

## 2022-02-20 ENCOUNTER — Ambulatory Visit (INDEPENDENT_AMBULATORY_CARE_PROVIDER_SITE_OTHER): Payer: BC Managed Care – PPO | Admitting: Family Medicine

## 2022-02-20 ENCOUNTER — Encounter: Payer: Self-pay | Admitting: Family Medicine

## 2022-02-20 VITALS — BP 122/78 | HR 90 | Temp 98.1°F | Resp 16 | Ht 60.0 in | Wt 155.0 lb

## 2022-02-20 DIAGNOSIS — Z23 Encounter for immunization: Secondary | ICD-10-CM | POA: Diagnosis not present

## 2022-02-20 DIAGNOSIS — E559 Vitamin D deficiency, unspecified: Secondary | ICD-10-CM

## 2022-02-20 DIAGNOSIS — R7303 Prediabetes: Secondary | ICD-10-CM | POA: Diagnosis not present

## 2022-02-20 DIAGNOSIS — E669 Obesity, unspecified: Secondary | ICD-10-CM

## 2022-02-20 DIAGNOSIS — E66811 Obesity, class 1: Secondary | ICD-10-CM

## 2022-02-20 DIAGNOSIS — I10 Essential (primary) hypertension: Secondary | ICD-10-CM | POA: Diagnosis not present

## 2022-02-20 DIAGNOSIS — E785 Hyperlipidemia, unspecified: Secondary | ICD-10-CM

## 2022-02-20 MED ORDER — HYDROCHLOROTHIAZIDE 12.5 MG PO CAPS: 12.5000 mg | ORAL_CAPSULE | Freq: Every day | ORAL | 1 refills | Status: DC

## 2022-02-20 MED ORDER — VITAMIN D (ERGOCALCIFEROL) 1.25 MG (50000 UNIT) PO CAPS: 50000.0000 [IU] | ORAL_CAPSULE | ORAL | 1 refills | Status: DC

## 2022-02-20 MED ORDER — METFORMIN HCL ER 750 MG PO TB24: 750.0000 mg | ORAL_TABLET | Freq: Every day | ORAL | 1 refills | Status: DC

## 2022-04-20 ENCOUNTER — Encounter: Payer: Self-pay | Admitting: Oncology

## 2022-08-10 ENCOUNTER — Other Ambulatory Visit: Payer: Self-pay | Admitting: Family Medicine

## 2022-08-10 DIAGNOSIS — I10 Essential (primary) hypertension: Secondary | ICD-10-CM

## 2022-08-10 DIAGNOSIS — R7303 Prediabetes: Secondary | ICD-10-CM

## 2022-08-14 ENCOUNTER — Encounter: Payer: Self-pay | Admitting: Oncology

## 2022-08-20 NOTE — Progress Notes (Signed)
Name: [name] [name]   MRN: [id number]    DOB: 02-Jan-1971   Date:08/21/2022       Progress Note  Subjective  Chief Complaint  Follow Up  HPI  Pre-diabetes: there is a family history of DM and she has a personal  history of gestational diabetes, denies polyphagia, polydipsia or polyuria. A1C has been stable at 6.1 %  She started Metformin 500 mg ER last Summer 22 , it helps with cravings and she has been losing weight   Obesity: she is mindful about her diet, and increased physical activity over the last month. Her weight is trending down now, lost 8 lbs since last visit    HTN: she is compliant with medication,only on HCTZ , bp is at goal, no chest pain, palpitation or sob. 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 Discussed starting statin therapy, she wants to try otc fish oils first .    GERD: symptoms are intermittent, she states she is feeling better, she has a hiatal hernia but no problems Unchanged    Vitamin D def:she is taking a rx vitamin D every other week., last level was at goal   Patient Active Problem List   Diagnosis Date Noted   Rectal polyp    Diverticulosis of large intestine without diverticulitis    Hiatal hernia    History of iron deficiency anemia    Pre-diabetes 10/13/2017   Intermittent low back pain 10/13/2017   History of gestational diabetes 10/13/2017   Hyperlipidemia 05/08/2016   Essential hypertension 12/20/2014   Gastro-esophageal reflux disease without esophagitis 01/25/2010    Past Surgical History:  Procedure Laterality Date   CESAREAN SECTION     COLONOSCOPY WITH PROPOFOL N/A 07/08/2018   Procedure: COLONOSCOPY WITH PROPOFOL;  Surgeon: Virgel Manifold, MD;  Location: [facility];  Service: Endoscopy;  Laterality: N/A;    ESOPHAGOGASTRODUODENOSCOPY (EGD) WITH PROPOFOL N/A 07/08/2018   Procedure: ESOPHAGOGASTRODUODENOSCOPY (EGD) WITH PROPOFOL;  Surgeon: Virgel Manifold, MD;  Location: [facility];  Service: Endoscopy;  Laterality: N/A;   TEAR DUCT PROBING      Family History  Problem Relation Age of Onset   Hypertension Mother    Hyperlipidemia Mother    Hypertension Father    Breast cancer Neg Hx     Social History   Tobacco Use   Smoking status: Never   Smokeless tobacco: Never  Substance Use Topics   Alcohol use: Yes    Alcohol/week: 1.0 standard drink of alcohol    Types: 1 Glasses of wine per week    Comment: seldom  every 4-5 months     Current Outpatient Medications:    docusate sodium (COLACE) 100 MG capsule, TAKE 1 CAPSULE BY MOUTH TWICE A DAY, Disp: 90 capsule, Rfl: 1   ferrous sulfate 325 (65 FE) MG EC tablet, Take 1 tablet (325 mg total) by mouth every other day., Disp: 30 tablet, Rfl: 1   hydrochlorothiazide (MICROZIDE) 12.5 MG capsule, TAKE 1 CAPSULE (12.5 MG TOTAL) BY MOUTH DAILY., Disp: 30 capsule, Rfl: 0   metFORMIN (GLUCOPHAGE-XR) 750 MG 24 hr tablet, TAKE 1 TABLET (750 MG TOTAL) BY MOUTH DAILY WITH BREAKFAST., Disp: 30 tablet, Rfl: 0   Vitamin D, Ergocalciferol, (DRISDOL) 1.25 MG (50000 UNIT) CAPS capsule, Take 1  capsule (50,000 Units total) by mouth every 14 (fourteen) days., Disp: 6 capsule, Rfl: 1  Allergies  Allergen Reactions   Zantac [Ranitidine Hcl]     I personally reviewed active problem list, medication list, allergies, family history, social history, health maintenance with the patient/caregiver today.   ROS  Constitutional: Negative for fever positive for weight change.  Respiratory: Negative for cough and shortness of breath.   Cardiovascular: Negative for chest pain or palpitations.  Gastrointestinal: Negative for abdominal pain, no bowel changes.  Musculoskeletal: Negative for gait problem or joint swelling.  Skin: Negative for rash.   Neurological: Negative for dizziness or headache.  No other specific complaints in a complete review of systems (except as listed in HPI above).   Objective  Vitals:   08/21/22 0800  BP: 128/70  Pulse: 83  Resp: 16  SpO2: 98%  Weight: 148 lb (67.1 kg)  Height: 5' (1.524 m)    Body mass index is 28.9 kg/m.  Physical Exam  Constitutional: Patient appears well-developed and well-nourished.  No distress.  HEENT: head atraumatic, normocephalic, pupils equal and reactive to light,, neck supple Cardiovascular: Normal rate, regular rhythm and normal heart sounds.  No murmur heard. No BLE edema. Pulmonary/Chest: Effort normal and breath sounds normal. No respiratory distress. Abdominal: Soft.  There is no tenderness. Psychiatric: Patient has a normal mood and affect. behavior is normal. Judgment and thought content normal.    PHQ2/9:    08/21/2022    7:59 AM 02/20/2022    9:00 AM 01/13/2022    9:24 AM 08/13/2021    8:57 AM 02/13/2021    9:19 AM  Depression screen PHQ 2/9  Decreased Interest 0 0 0 0 0  Down, Depressed, Hopeless 0 0 0 0 0  PHQ - 2 Score 0 0 0 0 0  Altered sleeping 0 0 0 0   Tired, decreased energy 0 0 0 0   Change in appetite 0 0 0 0   Feeling bad or failure about yourself  0 0 0 0   Trouble concentrating 0 0 0 0   Moving slowly or fidgety/restless 0 0 0 0   Suicidal thoughts 0 0 0 0   PHQ-9 Score 0 0 0 0     phq 9 is negative   Fall Risk:    08/21/2022    7:59 AM 02/20/2022    9:00 AM 01/13/2022    9:24 AM 08/13/2021    8:57 AM 02/13/2021    9:19 AM  Fall Risk   Falls in the past year? 0 0 0 0 0  Number falls in past yr: 0 0 0 0 0  Injury with Fall? 0 0 0 0 0  Risk for fall due to : No Fall Risks No Fall Risks No Fall Risks No Fall Risks No Fall Risks  Follow up Falls prevention discussed Falls prevention discussed Falls prevention discussed Falls prevention discussed Falls prevention discussed      Functional Status Survey: Is the patient deaf or have  difficulty hearing?: No Does the patient have difficulty seeing, even when wearing glasses/contacts?: No Does the patient have difficulty concentrating, remembering, or making decisions?: No Does the patient have difficulty walking or climbing stairs?: No Does the patient have difficulty dressing or bathing?: No Does the patient have difficulty doing errands alone such as visiting a doctor's office or shopping?: No    Assessment & Plan  1. Pre-diabetes  - metFORMIN (GLUCOPHAGE-XR) 750 MG 24 hr tablet; Take 1 tablet (  750 mg total) by mouth daily with breakfast.  Dispense: 90 tablet; Refill: 1  2. Pure hypercholesterolemia  Discussed starting statin therapy, she wants to hold off until next visit   3. Vitamin D insufficiency  - Vitamin D, Ergocalciferol, (DRISDOL) 1.25 MG (50000 UNIT) CAPS capsule; Take 1 capsule (50,000 Units total) by mouth every 14 (fourteen) days.  Dispense: 6 capsule; Refill: 1  4. History of iron deficiency anemia  She no longer has cycles, we will decrease supplementation when ferritin above 50  5. Essential hypertension  - hydrochlorothiazide (MICROZIDE) 12.5 MG capsule; Take 1 capsule (12.5 mg total) by mouth daily.  Dispense: 90 capsule; Refill: 1

## 2022-08-21 ENCOUNTER — Encounter: Payer: Self-pay | Admitting: Family Medicine

## 2022-08-21 ENCOUNTER — Encounter: Payer: Self-pay | Admitting: Oncology

## 2022-08-21 ENCOUNTER — Ambulatory Visit: Payer: BC Managed Care – PPO | Admitting: Family Medicine

## 2022-08-21 VITALS — BP 128/70 | HR 83 | Resp 16 | Ht 60.0 in | Wt 148.0 lb

## 2022-08-21 DIAGNOSIS — E7800 Pure hypercholesterolemia, unspecified: Secondary | ICD-10-CM | POA: Diagnosis not present

## 2022-08-21 DIAGNOSIS — I10 Essential (primary) hypertension: Secondary | ICD-10-CM

## 2022-08-21 DIAGNOSIS — E559 Vitamin D deficiency, unspecified: Secondary | ICD-10-CM | POA: Diagnosis not present

## 2022-08-21 DIAGNOSIS — Z862 Personal history of diseases of the blood and blood-forming organs and certain disorders involving the immune mechanism: Secondary | ICD-10-CM | POA: Diagnosis not present

## 2022-08-21 DIAGNOSIS — R7303 Prediabetes: Secondary | ICD-10-CM

## 2022-08-21 MED ORDER — VITAMIN D (ERGOCALCIFEROL) 1.25 MG (50000 UNIT) PO CAPS: 50000.0000 [IU] | ORAL_CAPSULE | ORAL | 1 refills | Status: DC

## 2022-08-21 MED ORDER — HYDROCHLOROTHIAZIDE 12.5 MG PO CAPS: 12.5000 mg | ORAL_CAPSULE | Freq: Every day | ORAL | 1 refills | Status: DC

## 2022-08-21 MED ORDER — METFORMIN HCL ER 750 MG PO TB24: 750.0000 mg | ORAL_TABLET | Freq: Every day | ORAL | 1 refills | Status: DC

## 2023-01-14 NOTE — Progress Notes (Signed)
Name: [name] [name]   MRN: [id number]    DOB: [birth year]   Date:01/15/2023       Progress Note  Subjective  Chief Complaint  Medication Refill  HPI  Pre-diabetes: there is a family history of DM and she has a personal  history of gestational diabetes, denies polyphagia, polydipsia or polyuria. A1C has been stable at 6.1 %  She started Metformin 500 mg ER since Summer 22 , it helps with cravings , weight has been stable on medication. She is physically active about three times a week   Obesity: she is mindful about her diet, and increased physical activity over the last month. Weight is stable.    HTN: she is compliant with medication,only on HCTZ , bp is at goal, no chest pain, palpitation or sob. 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 Advised to monitor bp at work    Hypercholesteremia : discussed lipid panel , there is a family history of heart disease but in males in the late 25's. Discussed starting statin therapy, she is currently taking otc fish oil and we will recheck labs   The 10-year ASCVD risk score (Arnett DK, et al., 2019) is: 5.9%   Values used to calculate the score:     Age: 52 years     Sex: Female     Is Non-Hispanic African American: Yes     Diabetic: No     Tobacco smoker: No     Systolic Blood Pressure: 130 mmHg     Is BP treated: Yes     HDL Cholesterol: 51 mg/dL     Total Cholesterol: 245 mg/dL    GERD: symptoms are intermittent, she states she is feeling better, she has a hiatal hernia but no problems Stable    Vitamin D def:she is taking a rx vitamin D every other week., last level was at goal   Patient Active Problem List   Diagnosis Date Noted   Vitamin D insufficiency 08/21/2022   Rectal polyp    Diverticulosis of large intestine without diverticulitis    Hiatal hernia    History of iron deficiency anemia     Pre-diabetes 10/13/2017   Intermittent low back pain 10/13/2017   History of gestational diabetes 10/13/2017   Hyperlipidemia 05/08/2016   Essential hypertension 12/20/2014   Gastro-esophageal reflux disease without esophagitis 01/25/2010    Past Surgical History:  Procedure Laterality Date   CESAREAN SECTION     COLONOSCOPY WITH PROPOFOL N/A 07/08/2018   Procedure: COLONOSCOPY WITH PROPOFOL;  Surgeon: Pasty Spillers, MD;  Location: [facility] ENDOSCOPY;  Service: Endoscopy;  Laterality: N/A;   ESOPHAGOGASTRODUODENOSCOPY (EGD) WITH PROPOFOL N/A 07/08/2018   Procedure: ESOPHAGOGASTRODUODENOSCOPY (EGD) WITH PROPOFOL;  Surgeon: Pasty Spillers, MD;  Location: [facility] ENDOSCOPY;  Service: Endoscopy;  Laterality: N/A;   TEAR DUCT PROBING      Family History  Problem Relation Age of Onset   Hypertension Mother    Hyperlipidemia Mother    Hypertension Father    Breast cancer Neg Hx     Social History   Tobacco Use   Smoking status: Never   Smokeless tobacco: Never  Substance Use Topics   Alcohol use: Yes    Alcohol/week: 1.0 standard drink of alcohol    Types: 1 Glasses of wine per week    Comment: seldom  every 4-5 months  Current Outpatient Medications:    metFORMIN (GLUCOPHAGE-XR) 750 MG 24 hr tablet, Take 1 tablet (750 mg total) by mouth daily with breakfast., Disp: 90 tablet, Rfl: 1   Omega-3 Fatty Acids (OMEGA-3 FISH OIL PO), Take 1,400 mg by mouth daily., Disp: , Rfl:    Vitamin D, Ergocalciferol, (DRISDOL) 1.25 MG (50000 UNIT) CAPS capsule, Take 1 capsule (50,000 Units total) by mouth every 14 (fourteen) days., Disp: 6 capsule, Rfl: 1   hydrochlorothiazide (MICROZIDE) 12.5 MG capsule, Take 1 capsule (12.5 mg total) by mouth daily., Disp: 90 capsule, Rfl: 1  Allergies  Allergen Reactions   Zantac [Ranitidine Hcl]     I personally reviewed active problem list, medication list, allergies, family history, social history, health maintenance with the patient/caregiver  today.   ROS  Constitutional: Negative for fever or weight change.  Respiratory: Negative for cough and shortness of breath.   Cardiovascular: Negative for chest pain or palpitations.  Gastrointestinal: Negative for abdominal pain, no bowel changes.  Musculoskeletal: Negative for gait problem or joint swelling.  Skin: Negative for rash.  Neurological: Negative for dizziness or headache.  No other specific complaints in a complete review of systems (except as listed in HPI above).   Objective  Vitals:   01/15/23 0840 01/15/23 0922  BP: 132/78 130/80  Pulse: 91   Resp: 16   SpO2: 98%   Weight: 152 lb (68.9 kg)   Height: 5' (1.524 m)     Body mass index is 29.69 kg/m.  Physical Exam  Constitutional: Patient appears well-developed and well-nourished. Obese  No distress.  HEENT: head atraumatic, normocephalic, pupils equal and reactive to light, neck supple Cardiovascular: Normal rate, regular rhythm and normal heart sounds.  No murmur heard. No BLE edema. Pulmonary/Chest: Effort normal and breath sounds normal. No respiratory distress. Abdominal: Soft.  There is no tenderness. Psychiatric: Patient has a normal mood and affect. behavior is normal. Judgment and thought content normal.   PHQ2/9:    01/15/2023    8:39 AM 08/21/2022    7:59 AM 02/20/2022    9:00 AM 01/13/2022    9:24 AM 08/13/2021    8:57 AM  Depression screen PHQ 2/9  Decreased Interest 0 0 0 0 0  Down, Depressed, Hopeless 0 0 0 0 0  PHQ - 2 Score 0 0 0 0 0  Altered sleeping 0 0 0 0 0  Tired, decreased energy 0 0 0 0 0  Change in appetite 0 0 0 0 0  Feeling bad or failure about yourself  0 0 0 0 0  Trouble concentrating 0 0 0 0 0  Moving slowly or fidgety/restless 0 0 0 0 0  Suicidal thoughts 0 0 0 0 0  PHQ-9 Score 0 0 0 0 0    phq 9 is negative   Fall Risk:    01/15/2023    8:39 AM 08/21/2022    7:59 AM 02/20/2022    9:00 AM 01/13/2022    9:24 AM 08/13/2021    8:57 AM  Fall Risk   Falls in the past  year? 0 0 0 0 0  Number falls in past yr: 0 0 0 0 0  Injury with Fall? 0 0 0 0 0  Risk for fall due to : No Fall Risks No Fall Risks No Fall Risks No Fall Risks No Fall Risks  Follow up Falls prevention discussed Falls prevention discussed Falls prevention discussed Falls prevention discussed Falls prevention discussed      Functional  Status Survey: Is the patient deaf or have difficulty hearing?: No Does the patient have difficulty seeing, even when wearing glasses/contacts?: No Does the patient have difficulty concentrating, remembering, or making decisions?: No Does the patient have difficulty walking or climbing stairs?: No Does the patient have difficulty dressing or bathing?: No Does the patient have difficulty doing errands alone such as visiting a doctor's office or shopping?: No    Assessment & Plan  1. Essential hypertension  - hydrochlorothiazide (MICROZIDE) 12.5 MG capsule; Take 1 capsule (12.5 mg total) by mouth daily.  Dispense: 90 capsule; Refill: 1 - COMPLETE METABOLIC PANEL WITH GFR  2. Vitamin D insufficiency  Continue supplementation   3. Pure hypercholesterolemia  - Lipid panel  4. History of iron deficiency anemia  - CBC with Differential/Platelet  5. Pre-diabetes  - Hemoglobin A1c  6. Obesity (BMI 30.0-34.9)  Discussed with the patient the risk posed by an increased BMI. Discussed importance of portion control, calorie counting and at least 150 minutes of physical activity weekly. Avoid sweet beverages and drink more water. Eat at least 6 servings of fruit and vegetables daily    7. Long-term use of high-risk medication  - B12

## 2023-01-15 ENCOUNTER — Encounter: Payer: Self-pay | Admitting: Family Medicine

## 2023-01-15 ENCOUNTER — Ambulatory Visit: Payer: BC Managed Care – PPO | Admitting: Family Medicine

## 2023-01-15 VITALS — BP 130/80 | HR 91 | Resp 16 | Ht 60.0 in | Wt 152.0 lb

## 2023-01-15 DIAGNOSIS — E7800 Pure hypercholesterolemia, unspecified: Secondary | ICD-10-CM | POA: Diagnosis not present

## 2023-01-15 DIAGNOSIS — I10 Essential (primary) hypertension: Secondary | ICD-10-CM

## 2023-01-15 DIAGNOSIS — E559 Vitamin D deficiency, unspecified: Secondary | ICD-10-CM

## 2023-01-15 DIAGNOSIS — Z79899 Other long term (current) drug therapy: Secondary | ICD-10-CM

## 2023-01-15 DIAGNOSIS — E669 Obesity, unspecified: Secondary | ICD-10-CM

## 2023-01-15 DIAGNOSIS — R7303 Prediabetes: Secondary | ICD-10-CM

## 2023-01-15 DIAGNOSIS — Z862 Personal history of diseases of the blood and blood-forming organs and certain disorders involving the immune mechanism: Secondary | ICD-10-CM

## 2023-01-15 LAB — CBC WITH DIFFERENTIAL/PLATELET
Absolute Monocytes: 562 cells/uL (ref 200–950)
Basophils Absolute: 53 cells/uL (ref 0–200)
Basophils Relative: 0.7 %
Eosinophils Absolute: 175 cells/uL (ref 15–500)
Eosinophils Relative: 2.3 %
HCT: 40.6 % (ref 35.0–45.0)
Hemoglobin: 13.5 g/dL (ref 11.7–15.5)
Lymphs Abs: 1482 cells/uL (ref 850–3900)
MCH: 29.1 pg (ref 27.0–33.0)
MCHC: 33.3 g/dL (ref 32.0–36.0)
MCV: 87.5 fL (ref 80.0–100.0)
MPV: 11.6 fL (ref 7.5–12.5)
Monocytes Relative: 7.4 %
Neutro Abs: 5328 cells/uL (ref 1500–7800)
Neutrophils Relative %: 70.1 %
Platelets: 391 10*3/uL (ref 140–400)
RBC: 4.64 10*6/uL (ref 3.80–5.10)
RDW: 13.8 % (ref 11.0–15.0)
Total Lymphocyte: 19.5 %
WBC: 7.6 10*3/uL (ref 3.8–10.8)

## 2023-01-15 MED ORDER — HYDROCHLOROTHIAZIDE 12.5 MG PO CAPS: 12.5000 mg | ORAL_CAPSULE | Freq: Every day | ORAL | 1 refills | Status: DC

## 2023-03-04 LAB — HM MAMMOGRAPHY

## 2023-03-22 ENCOUNTER — Other Ambulatory Visit: Payer: Self-pay | Admitting: Family Medicine

## 2023-03-22 DIAGNOSIS — E559 Vitamin D deficiency, unspecified: Secondary | ICD-10-CM

## 2023-03-22 DIAGNOSIS — R7303 Prediabetes: Secondary | ICD-10-CM

## 2023-03-22 DIAGNOSIS — I10 Essential (primary) hypertension: Secondary | ICD-10-CM

## 2023-03-22 NOTE — Telephone Encounter (Signed)
Medication Refill - Medication:   Patient wanted to PCP to know she only has 2 pills of metFORMIN (GLUCOPHAGE-XR) 750 MG 24 hr tablet  and she does not want to run out.   Also requesting a refill Vitamin D, Ergocalciferol, (DRISDOL) 1.25 MG (50000 UNIT) CAPS capsule  and  hydrochlorothiazide (MICROZIDE) 12.5 MG capsule    Has the patient contacted their pharmacy? Yes.    (Agent: If yes, when and what did the pharmacy advise?) Contact PCP office   Preferred Pharmacy (with phone number or street name):   [id number] - [place], [place] - [address] Phone: [phone]  Fax: [phone]      Has the patient been seen for an appointment in the last year OR does the patient have an upcoming appointment? Yes.    Agent: Please be advised that RX refills may take up to 3 business days. We ask that you follow-up with your pharmacy.

## 2023-04-09 NOTE — Progress Notes (Unsigned)
Name: [name] [name]   MRN: [id number]    DOB: 10-04-1970   Date:04/12/2023       Progress Note  Subjective  Chief Complaint  Annual Exam  HPI  Patient presents for annual CPE.  Diet: cutting down on carbohydrates, cooking at home, smaller portions , eating more fish and chicken, lots of green vegetables.  Exercise: she has been active 3 days a week  Last Eye Exam: she will schedule a visit  Last Dental Exam: every 6 months   Flowsheet Row Office Visit from 01/08/2021 in [facility]  AUDIT-C Score 0      Depression: Phq 9 is  negative    04/12/2023    7:50 AM 01/15/2023    8:39 AM 08/21/2022    7:59 AM 02/20/2022    9:00 AM 01/13/2022    9:24 AM  Depression screen PHQ 2/9  Decreased Interest 0 0 0 0 0  Down, Depressed, Hopeless 0 0 0 0 0  PHQ - 2 Score 0 0 0 0 0  Altered sleeping 0 0 0 0 0  Tired, decreased energy 0 0 0 0 0  Change in appetite 0 0 0 0 0  Feeling bad or failure about yourself  0 0 0 0 0  Trouble concentrating 0 0 0 0 0  Moving slowly or fidgety/restless 0 0 0 0 0  Suicidal thoughts 0 0 0 0 0  PHQ-9 Score 0 0 0 0 0   Hypertension: BP Readings from Last 3 Encounters:  04/12/23 128/72  01/15/23 130/80  08/21/22 128/70   Obesity: Wt Readings from Last 3 Encounters:  04/12/23 147 lb (66.7 kg)  01/15/23 152 lb (68.9 kg)  08/21/22 148 lb (67.1 kg)   BMI Readings from Last 3 Encounters:  04/12/23 28.71 kg/m  01/15/23 29.69 kg/m  08/21/22 28.90 kg/m     Vaccines:   HPV: N/A Tdap: up to date Shingrix: up to date Pneumonia: N/A Flu: she had it at work  COVID-19: up to date   Hep C Screening: 08/10/18 STD testing and prevention (HIV/chl/gon/syphilis): N/A Intimate partner violence: negative screen  Sexual History : same partner/married/libido is down, no vaginal dryness Menstrual History/LMP/Abnormal Bleeding:  post menopausal  Discussed importance of follow up if any post-menopausal bleeding: yes  Incontinence  Symptoms: negative for symptoms   Breast cancer:  - Last Mammogram: 02/2023  - BRCA gene screening: N/A  Osteoporosis Prevention : Discussed high calcium and vitamin D supplementation, weight bearing exercises Bone density: N/A  Cervical cancer screening: 08/10/18, repeat today   Skin cancer: Discussed monitoring for atypical lesions  Colorectal cancer: 07/08/18  repeat 2025  Lung cancer:  Low Dose CT Chest recommended if Age 38-80 years, 20 pack-year currently smoking OR have quit w/in 15years. Patient does not qualify for screen   ECG: 10/13/17  Advanced Care Planning: A voluntary discussion about advance care planning including the explanation and discussion of advance directives.  Discussed health care proxy and Living will, and the patient was able to identify a health care proxy as husband .  Patient does not have a living will and power of attorney of health care   Lipids: Lab Results  Component Value Date   CHOL 264 (H) 01/15/2023   CHOL 245 (H) 01/13/2022   CHOL 233 (H) 08/12/2020   Lab Results  Component Value Date   HDL 56 01/15/2023   HDL 51 01/13/2022   HDL 52 08/12/2020   Lab Results  Component  Value Date   LDLCALC 184 (H) 01/15/2023   LDLCALC 165 (H) 01/13/2022   LDLCALC 161 (H) 08/12/2020   Lab Results  Component Value Date   TRIG 114 01/15/2023   TRIG 144 01/13/2022   TRIG 95 08/12/2020   Lab Results  Component Value Date   CHOLHDL 4.7 01/15/2023   CHOLHDL 4.8 01/13/2022   CHOLHDL 4.5 08/12/2020   No results found for: "LDLDIRECT"  Glucose: Glucose, Bld  Date Value Ref Range Status  01/15/2023 88 65 - 99 mg/dL Final    Comment:    .            Fasting reference interval .   01/13/2022 91 65 - 99 mg/dL Final    Comment:    .            Fasting reference interval .   08/12/2020 96 65 - 99 mg/dL Final    Comment:    .            Fasting reference interval .     Patient Active Problem List   Diagnosis Date Noted   Vitamin D  insufficiency 08/21/2022   Rectal polyp    Diverticulosis of large intestine without diverticulitis    Hiatal hernia    History of iron deficiency anemia    Pre-diabetes 10/13/2017   Intermittent low back pain 10/13/2017   History of gestational diabetes 10/13/2017   Hyperlipidemia 05/08/2016   Essential hypertension 12/20/2014   Gastro-esophageal reflux disease without esophagitis 01/25/2010    Past Surgical History:  Procedure Laterality Date   CESAREAN SECTION     COLONOSCOPY WITH PROPOFOL N/A 07/08/2018   Procedure: COLONOSCOPY WITH PROPOFOL;  Surgeon: Pasty Spillers, MD;  Location: [facility] ENDOSCOPY;  Service: Endoscopy;  Laterality: N/A;   ESOPHAGOGASTRODUODENOSCOPY (EGD) WITH PROPOFOL N/A 07/08/2018   Procedure: ESOPHAGOGASTRODUODENOSCOPY (EGD) WITH PROPOFOL;  Surgeon: Pasty Spillers, MD;  Location: [facility] ENDOSCOPY;  Service: Endoscopy;  Laterality: N/A;   TEAR DUCT PROBING      Family History  Problem Relation Age of Onset   Hypertension Mother    Hyperlipidemia Mother    Hypertension Father    Breast cancer Neg Hx     Social History   Socioeconomic History   Marital status: Married    Spouse name: Ricky    Number of children: 2   Years of education: Not on file   Highest education level: Master's degree (e.g., MA, MS, MEng, MEd, MSW, MBA)  Occupational History   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   Tobacco Use   Smoking status: Never   Smokeless tobacco: Never  Vaping Use   Vaping status: Never Used  Substance and Sexual Activity   Alcohol use: Yes    Alcohol/week: 1.0 standard drink of alcohol    Types: 1 Glasses of wine per week    Comment: seldom  every 4-5 months   Drug use: No   Sexual activity: Yes    Partners: Male    Birth control/protection: Surgical  Other Topics Concern   Not on file  Social History Narrative   Two boys, 14 years apart    Social Determinants of Health   Financial  Resource Strain: Low Risk  (04/12/2023)   Overall Financial Resource Strain (CARDIA)    Difficulty of Paying Living Expenses: Not hard at all  Food Insecurity: No Food Insecurity (04/12/2023)   Hunger Vital Sign    Worried About Running Out of  Food in the Last Year: Never true    Ran Out of Food in the Last Year: Never true  Transportation Needs: No Transportation Needs (04/12/2023)   PRAPARE - Administrator, Civil Service (Medical): No    Lack of Transportation (Non-Medical): No  Physical Activity: Insufficiently Active (04/12/2023)   Exercise Vital Sign    Days of Exercise per Week: 3 days    Minutes of Exercise per Session: 30 min  Stress: No Stress Concern Present (04/12/2023)   [employer] of Occupational Health - Occupational Stress Questionnaire    Feeling of Stress : Not at all  Social Connections: Moderately Integrated (04/12/2023)   Social Connection and Isolation Panel [NHANES]    Frequency of Communication with Friends and Family: More than three times a week    Frequency of Social Gatherings with Friends and Family: Three times a week    Attends Religious Services: More than 4 times per year    Active Member of Clubs or Organizations: No    Attends Banker Meetings: Never    Marital Status: Married  Catering manager Violence: Not At Risk (04/12/2023)   Humiliation, Afraid, Rape, and Kick questionnaire    Fear of Current or Ex-Partner: No    Emotionally Abused: No    Physically Abused: No    Sexually Abused: No     Current Outpatient Medications:    hydrochlorothiazide (MICROZIDE) 12.5 MG capsule, Take 1 capsule (12.5 mg total) by mouth daily., Disp: 90 capsule, Rfl: 1   metFORMIN (GLUCOPHAGE-XR) 750 MG 24 hr tablet, TAKE 1 TABLET BY MOUTH DAILY WITH BREAKFAST, Disp: 30 tablet, Rfl: 0   Omega-3 Fatty Acids (OMEGA-3 FISH OIL PO), Take 1,400 mg by mouth daily., Disp: , Rfl:    Vitamin D, Ergocalciferol, (DRISDOL) 1.25 MG (50000 UNIT) CAPS  capsule, TAKE 1 CAPSULE (50,000 UNITS TOTAL) BY MOUTH EVERY 14 (FOURTEEN) DAYS., Disp: 2 capsule, Rfl: 0  Allergies  Allergen Reactions   Zantac [Ranitidine Hcl]      ROS  Constitutional: Negative for fever, positive for  weight change.  Respiratory: Negative for cough and shortness of breath.   Cardiovascular: Negative for chest pain or palpitations.  Gastrointestinal: Negative for abdominal pain, no bowel changes.  Musculoskeletal: Negative for gait problem or joint swelling.  Skin: Negative for rash.  Neurological: Negative for dizziness or headache.  No other specific complaints in a complete review of systems (except as listed in HPI above).   Objective  Vitals:   04/12/23 0752  BP: 128/72  Pulse: 91  Resp: 16  SpO2: 97%  Weight: 147 lb (66.7 kg)  Height: 5' (1.524 m)    Body mass index is 28.71 kg/m.  Physical Exam  Constitutional: Patient appears well-developed and well-nourished. No distress.  HENT: Head: Normocephalic and atraumatic. Ears: B TMs ok, no erythema or effusion; Nose: Nose normal. Mouth/Throat: Oropharynx is clear and moist. No oropharyngeal exudate.  Eyes: Conjunctivae and EOM are normal. Pupils are equal, round, and reactive to light. No scleral icterus.  Neck: Normal range of motion. Neck supple. No thyromegaly present. follow up in a few weeks  Cardiovascular: Normal rate, regular rhythm and normal heart sounds.  No murmur heard. No BLE edema. Pulmonary/Chest: Effort normal and breath sounds normal. No respiratory distress. Abdominal: Soft. Bowel sounds are normal, no distension. There is no tenderness. no masses Breast: no lumps or masses, no nipple discharge or rashes FEMALE GENITALIA:  External genitalia normal External urethra normal Vaginal vault normal  without discharge or lesions Cervix normal without discharge or lesions Bimanual exam normal without masses RECTAL: not done  Musculoskeletal: Normal range of motion, no joint effusions.  No gross deformities Neurological: he is alert and oriented to person, place, and time. No cranial nerve deficit. Coordination, balance, strength, speech and gait are normal.  Skin: Skin is warm and dry. No rash noted. No erythema.  Psychiatric: Patient has a normal mood and affect. behavior is normal. Judgment and thought content normal.   Recent Results (from the past 2160 hour(s))  COMPLETE METABOLIC PANEL WITH GFR     Status: Abnormal   Collection Time: 01/15/23  9:30 AM  Result Value Ref Range   Glucose, Bld 88 65 - 99 mg/dL    Comment: .            Fasting reference interval .    BUN 13 7 - 25 mg/dL   Creat 1.61 0.96 - 0.45 mg/dL   eGFR 73 > OR = 60 WU/JWJ/1.91Y7   BUN/Creatinine Ratio SEE NOTE: 6 - 22 (calc)    Comment:    Not Reported: BUN and Creatinine are within    reference range. .    Sodium 142 135 - 146 mmol/L   Potassium 4.2 3.5 - 5.3 mmol/L   Chloride 102 98 - 110 mmol/L   CO2 27 20 - 32 mmol/L   Calcium 10.5 (H) 8.6 - 10.4 mg/dL   Total Protein 7.5 6.1 - 8.1 g/dL   Albumin 4.9 3.6 - 5.1 g/dL   Globulin 2.6 1.9 - 3.7 g/dL (calc)   AG Ratio 1.9 1.0 - 2.5 (calc)   Total Bilirubin 0.3 0.2 - 1.2 mg/dL   Alkaline phosphatase (APISO) 58 37 - 153 U/L   AST 20 10 - 35 U/L   ALT 19 6 - 29 U/L  CBC with Differential/Platelet     Status: None   Collection Time: 01/15/23  9:30 AM  Result Value Ref Range   WBC 7.6 3.8 - 10.8 Thousand/uL   RBC 4.64 3.80 - 5.10 Million/uL   Hemoglobin 13.5 11.7 - 15.5 g/dL   HCT 82.9 56.2 - 13.0 %   MCV 87.5 80.0 - 100.0 fL   MCH 29.1 27.0 - 33.0 pg   MCHC 33.3 32.0 - 36.0 g/dL   RDW 86.5 78.4 - 69.6 %   Platelets 391 140 - 400 Thousand/uL   MPV 11.6 7.5 - 12.5 fL   Neutro Abs 5,328 1,500 - 7,800 cells/uL   Lymphs Abs 1,482 850 - 3,900 cells/uL   Absolute Monocytes 562 200 - 950 cells/uL   Eosinophils Absolute 175 15 - 500 cells/uL   Basophils Absolute 53 0 - 200 cells/uL   Neutrophils Relative % 70.1 %   Total Lymphocyte 19.5  %   Monocytes Relative 7.4 %   Eosinophils Relative 2.3 %   Basophils Relative 0.7 %  Lipid panel     Status: Abnormal   Collection Time: 01/15/23  9:30 AM  Result Value Ref Range   Cholesterol 264 (H) <200 mg/dL   HDL 56 > OR = 50 mg/dL   Triglycerides 295 <284 mg/dL   LDL Cholesterol (Calc) 184 (H) mg/dL (calc)    Comment: Reference range: <100 . Desirable range <100 mg/dL for primary prevention;   <70 mg/dL for patients with CHD or diabetic patients  with > or = 2 CHD risk factors. Marland Kitchen LDL-C is now calculated using the Martin-Hopkins  calculation, which is a validated novel method providing  better accuracy than the Friedewald equation in the  estimation of LDL-C.  Horald Pollen et al. Lenox Ahr. 1610;960(45): 2061-2068  (http://education.[facility].com/faq/FAQ164)    Total CHOL/HDL Ratio 4.7 <5.0 (calc)   Non-HDL Cholesterol (Calc) 208 (H) <130 mg/dL (calc)    Comment: For patients with diabetes plus 1 major ASCVD risk  factor, treating to a non-HDL-C goal of <100 mg/dL  (LDL-C of <40 mg/dL) is considered a therapeutic  option.   Hemoglobin A1c     Status: Abnormal   Collection Time: 01/15/23  9:30 AM  Result Value Ref Range   Hgb A1c MFr Bld 6.4 (H) <5.7 % of total Hgb    Comment: For someone without known diabetes, a hemoglobin  A1c value between 5.7% and 6.4% is consistent with prediabetes and should be confirmed with a  follow-up test. . For someone with known diabetes, a value <7% indicates that their diabetes is well controlled. A1c targets should be individualized based on duration of diabetes, age, comorbid conditions, and other considerations. . This assay result is consistent with an increased risk of diabetes. . Currently, no consensus exists regarding use of hemoglobin A1c for diagnosis of diabetes for children. .    Mean Plasma Glucose 137 mg/dL   eAG (mmol/L) 7.6 mmol/L    Comment: . This test was performed on the Roche cobas c503  platform. Effective 03/16/22, a change in test platforms from the Abbott Architect to the Roche cobas c503 may have shifted HbA1c results compared to historical results. Based on laboratory validation testing conducted at [facility], the Roche platform relative to the Abbott platform had an average increase in HbA1c value of < or = 0.3%. This difference is within accepted  variability established by the [facility]. Note that not all individuals will have had a shift in their results and direct comparisons between historical and current results for testing conducted on different platforms is not recommended.   B12     Status: None   Collection Time: 01/15/23  9:30 AM  Result Value Ref Range   Vitamin B-12 422 200 - 1,100 pg/mL     Fall Risk:    04/12/2023    7:50 AM 01/15/2023    8:39 AM 08/21/2022    7:59 AM 02/20/2022    9:00 AM 01/13/2022    9:24 AM  Fall Risk   Falls in the past year? 0 0 0 0 0  Number falls in past yr: 0 0 0 0 0  Injury with Fall? 0 0 0 0 0  Risk for fall due to : No Fall Risks No Fall Risks No Fall Risks No Fall Risks No Fall Risks  Follow up Falls prevention discussed Falls prevention discussed Falls prevention discussed Falls prevention discussed Falls prevention discussed     Functional Status Survey: Is the patient deaf or have difficulty hearing?: No Does the patient have difficulty seeing, even when wearing glasses/contacts?: No Does the patient have difficulty concentrating, remembering, or making decisions?: No Does the patient have difficulty walking or climbing stairs?: No Does the patient have difficulty dressing or bathing?: No Does the patient have difficulty doing errands alone such as visiting a doctor's office or shopping?: No   Assessment & Plan   1. Well adult exam   2. Cervical cancer screening  - Cytology - PAP  3. Colon cancer screening  - Ambulatory referral to Gastroenterology   -USPSTF  grade A and B recommendations reviewed with patient; age-appropriate recommendations, preventive care, screening tests,  etc discussed and encouraged; healthy living encouraged; see AVS for patient education given to patient -Discussed importance of 150 minutes of physical activity weekly, eat two servings of fish weekly, eat one serving of tree nuts ( cashews, pistachios, pecans, almonds.Marland Kitchen) every other day, eat 6 servings of fruit/vegetables daily and drink plenty of water and avoid sweet beverages.   -Reviewed Health Maintenance: Yes

## 2023-04-12 ENCOUNTER — Other Ambulatory Visit: Payer: Self-pay

## 2023-04-12 ENCOUNTER — Ambulatory Visit (INDEPENDENT_AMBULATORY_CARE_PROVIDER_SITE_OTHER): Payer: BC Managed Care – PPO | Admitting: Family Medicine

## 2023-04-12 ENCOUNTER — Encounter: Payer: Self-pay | Admitting: Family Medicine

## 2023-04-12 ENCOUNTER — Other Ambulatory Visit (HOSPITAL_COMMUNITY)
Admission: RE | Admit: 2023-04-12 | Discharge: 2023-04-12 | Disposition: A | Discharge status: Home / Self Care | Payer: BC Managed Care – PPO | Source: Ambulatory Visit | Attending: Family Medicine | Admitting: Family Medicine

## 2023-04-12 VITALS — BP 128/72 | HR 91 | Resp 16 | Ht 60.0 in | Wt 147.0 lb

## 2023-04-12 DIAGNOSIS — Z1211 Encounter for screening for malignant neoplasm of colon: Secondary | ICD-10-CM

## 2023-04-12 DIAGNOSIS — E559 Vitamin D deficiency, unspecified: Secondary | ICD-10-CM

## 2023-04-12 DIAGNOSIS — Z124 Encounter for screening for malignant neoplasm of cervix: Secondary | ICD-10-CM

## 2023-04-12 DIAGNOSIS — Z0000 Encounter for general adult medical examination without abnormal findings: Secondary | ICD-10-CM

## 2023-04-12 DIAGNOSIS — R7303 Prediabetes: Secondary | ICD-10-CM

## 2023-04-12 MED ORDER — METFORMIN HCL ER 750 MG PO TB24: 750.0000 mg | ORAL_TABLET | Freq: Every day | ORAL | 0 refills | Status: DC

## 2023-04-12 MED ORDER — VITAMIN D (ERGOCALCIFEROL) 1.25 MG (50000 UNIT) PO CAPS
50000.0000 [IU] | ORAL_CAPSULE | ORAL | 0 refills | Status: DC
Start: 2023-04-12 — End: 2023-07-01

## 2023-04-13 ENCOUNTER — Encounter: Payer: Self-pay | Admitting: Oncology

## 2023-04-15 LAB — CYTOLOGY - PAP
Comment: NEGATIVE
Diagnosis: NEGATIVE
High risk HPV: NEGATIVE

## 2023-04-19 ENCOUNTER — Other Ambulatory Visit: Payer: Self-pay | Admitting: Family Medicine

## 2023-04-19 DIAGNOSIS — R7303 Prediabetes: Secondary | ICD-10-CM

## 2023-04-21 ENCOUNTER — Encounter: Payer: Self-pay | Admitting: *Deleted

## 2023-06-07 ENCOUNTER — Encounter: Payer: Self-pay | Admitting: Oncology

## 2023-06-17 IMAGING — MG MM DIGITAL SCREENING BILAT W/ TOMO AND CAD
8 series · 8 of 24 positions shown · non-contrast
Comparison: Previous exam(s).

CLINICAL DATA: Screening.

EXAM:
DIGITAL SCREENING BILATERAL MAMMOGRAM WITH TOMOSYNTHESIS AND CAD
TECHNIQUE: Bilateral screening digital craniocaudal and mediolateral oblique
mammograms were obtained. Bilateral screening digital breast
tomosynthesis was performed. The images were evaluated with
computer-aided detection.

[R MLO synth-2D]
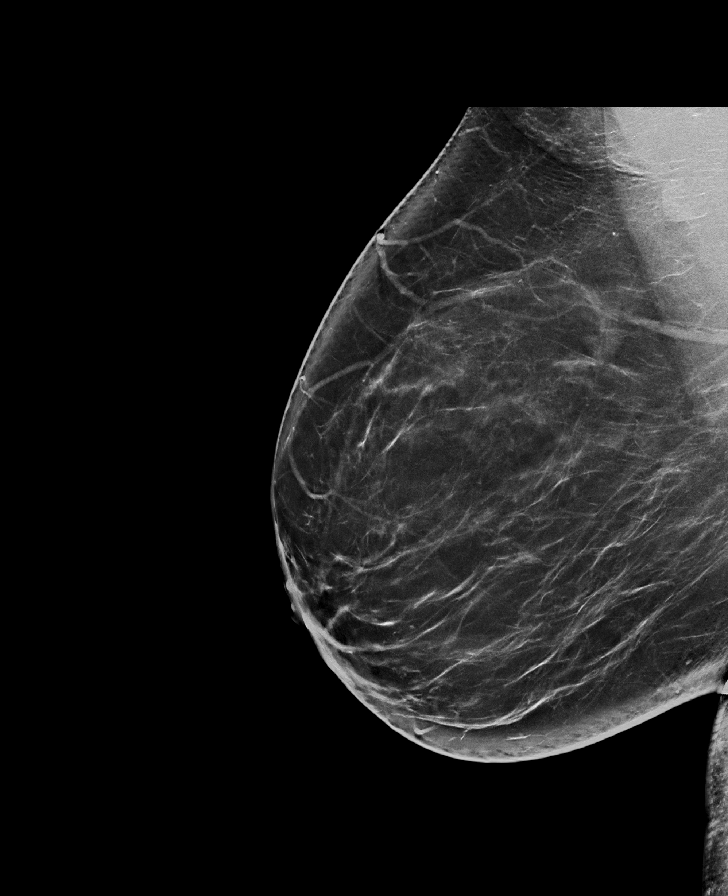

[L CC synth-2D]
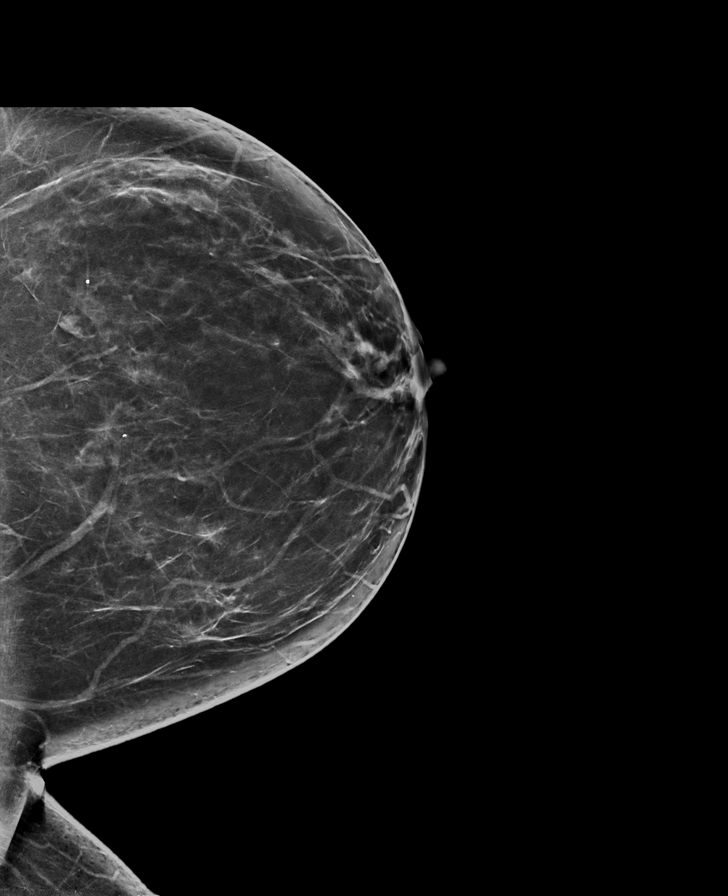

[R CC synth-2D]
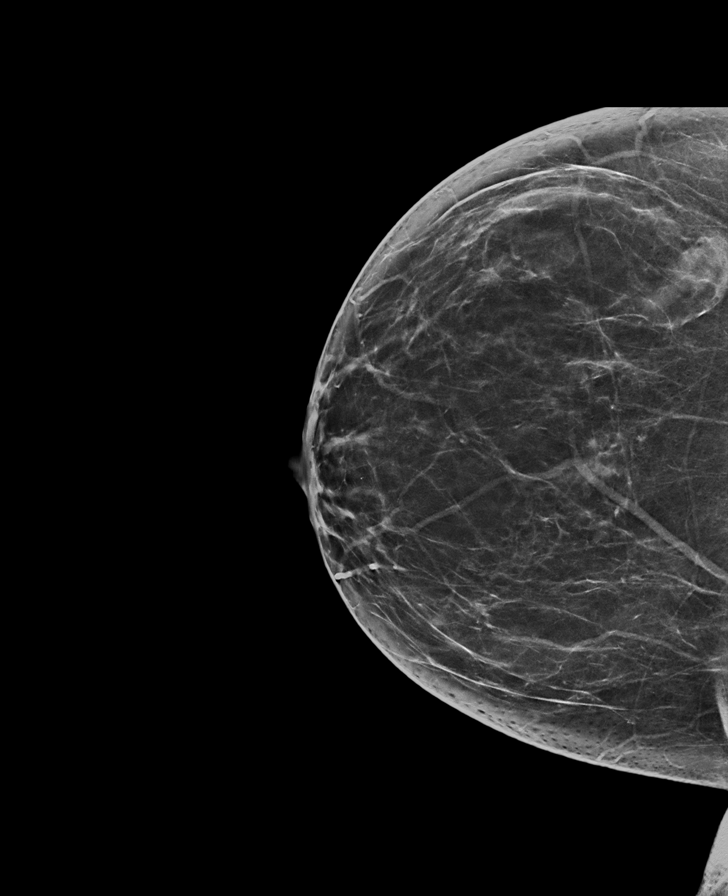

[L MLO synth-2D]
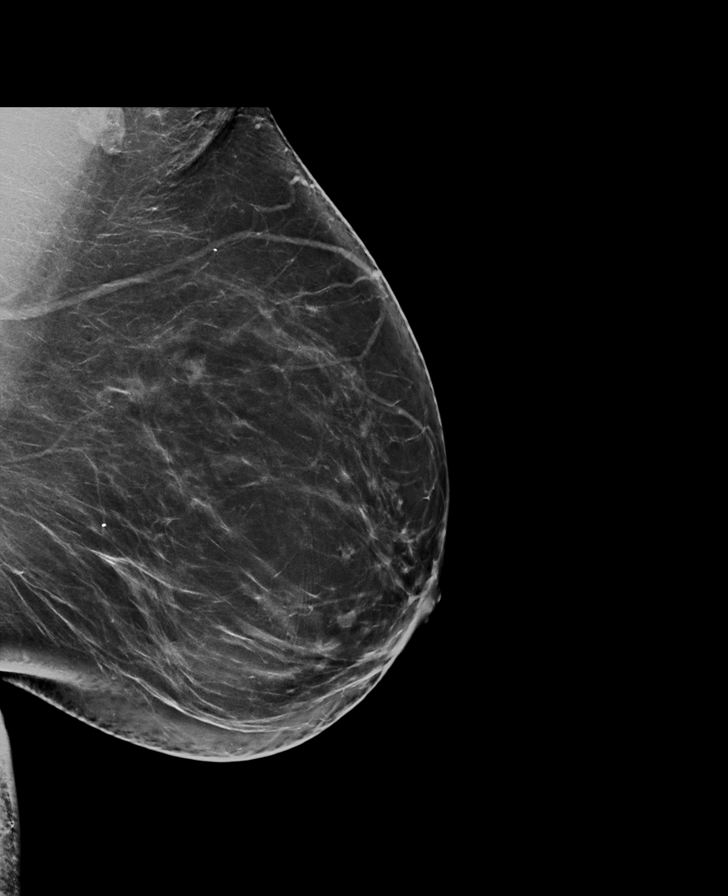

[L MLO tomo · tomo slice 46/91.0]
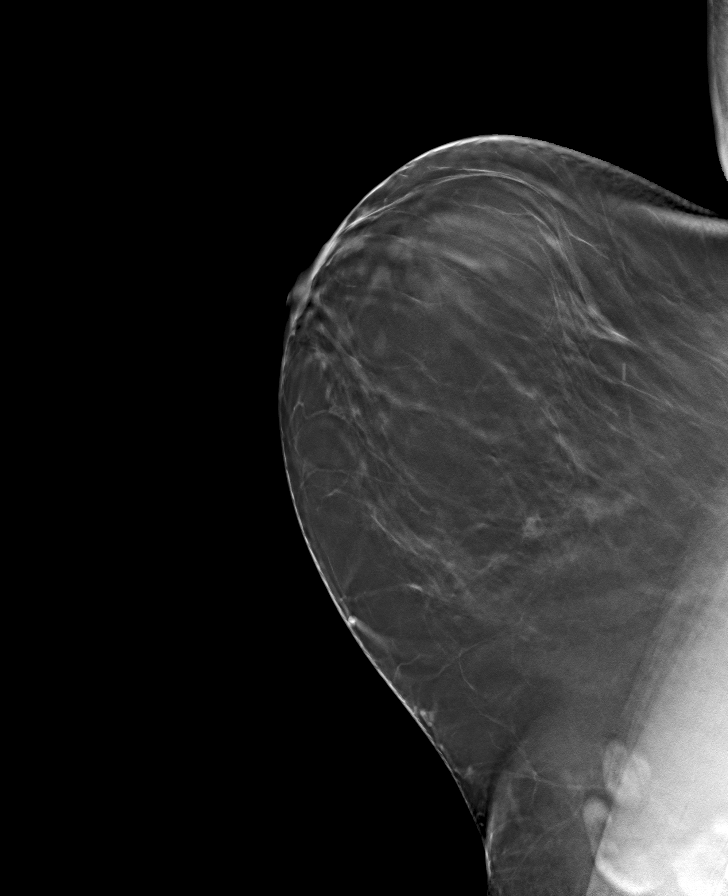

[R MLO tomo · tomo slice 45/90.0]
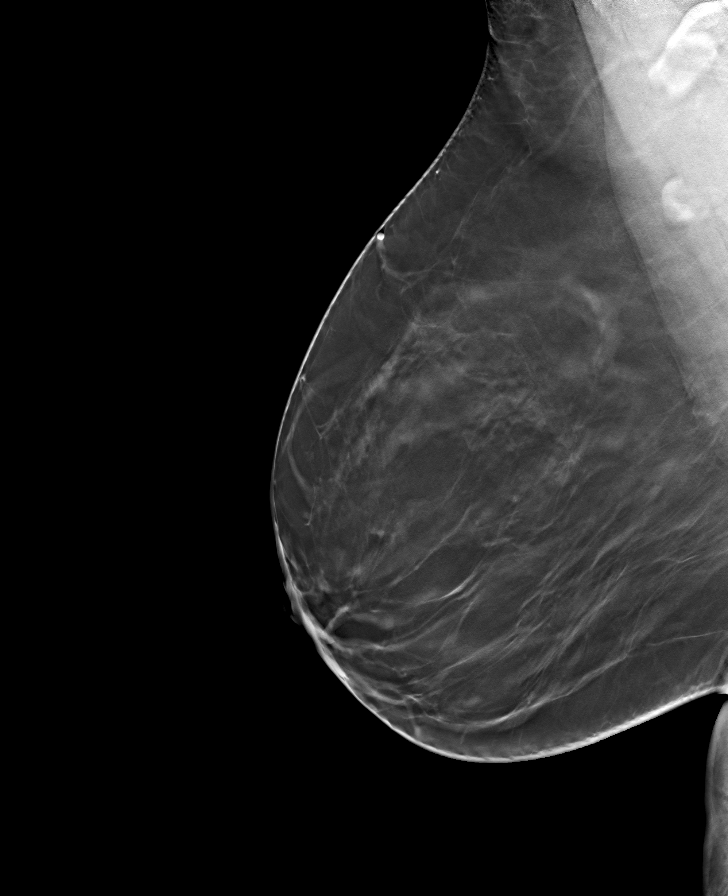

[L CC tomo · tomo slice 40/79.0]
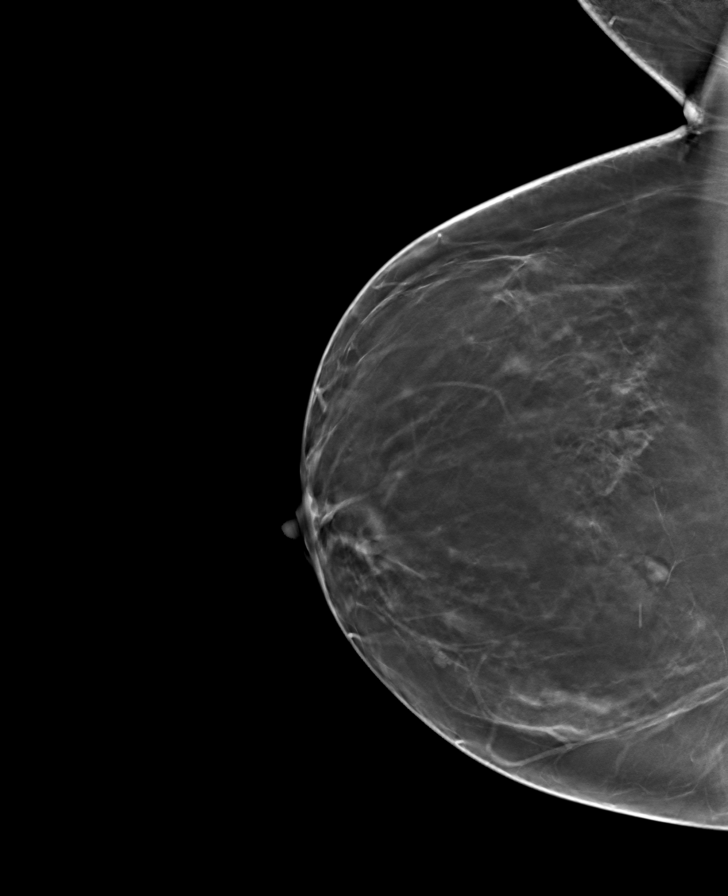

[R CC tomo · tomo slice 39/77.0]
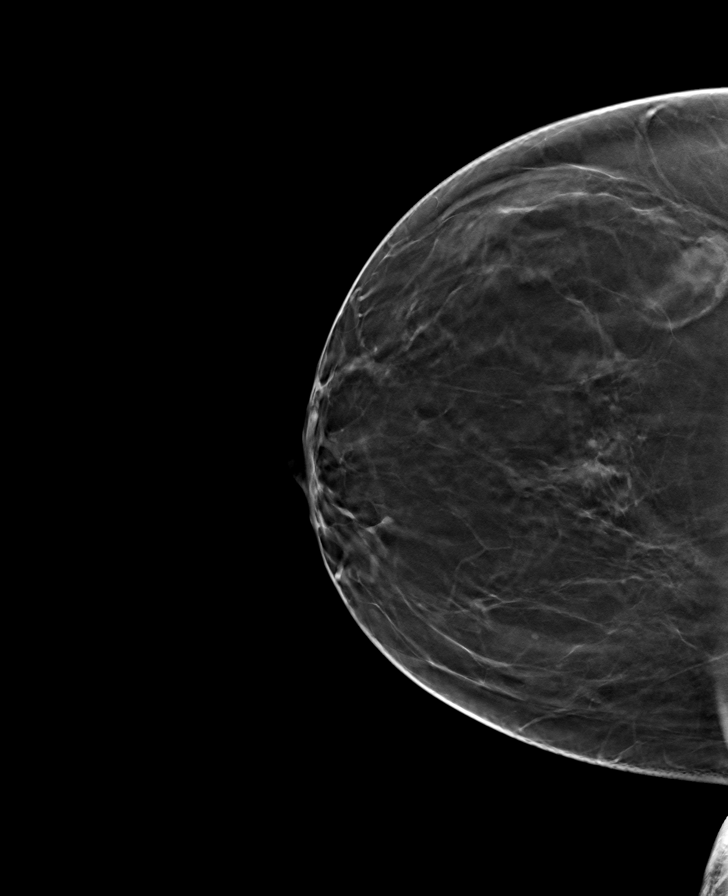

[8 of 24 positions shown; findings below may reference images not displayed]

ACR Breast Density Category b: There are scattered areas of
fibroglandular density.
FINDINGS: There are no findings suspicious for malignancy.
IMPRESSION: No mammographic evidence of malignancy. A result letter of this
screening mammogram will be mailed directly to the patient.

RECOMMENDATION:
Screening mammogram in one year. (Code:51-O-LD2)

BI-RADS CATEGORY  1: Negative.

## 2023-07-01 ENCOUNTER — Other Ambulatory Visit: Payer: Self-pay | Admitting: Family Medicine

## 2023-07-01 DIAGNOSIS — E559 Vitamin D deficiency, unspecified: Secondary | ICD-10-CM

## 2023-07-13 ENCOUNTER — Ambulatory Visit: Payer: BC Managed Care – PPO | Admitting: Family Medicine

## 2023-07-15 ENCOUNTER — Encounter: Payer: Self-pay | Admitting: Oncology

## 2023-07-15 ENCOUNTER — Ambulatory Visit: Payer: 59 | Admitting: Family Medicine

## 2023-07-15 ENCOUNTER — Encounter: Payer: Self-pay | Admitting: Family Medicine

## 2023-07-15 VITALS — BP 142/80 | HR 93 | Temp 98.1°F | Resp 16 | Ht 60.0 in | Wt 140.5 lb

## 2023-07-15 DIAGNOSIS — I10 Essential (primary) hypertension: Secondary | ICD-10-CM | POA: Diagnosis not present

## 2023-07-15 DIAGNOSIS — R7989 Other specified abnormal findings of blood chemistry: Secondary | ICD-10-CM | POA: Insufficient documentation

## 2023-07-15 DIAGNOSIS — E7800 Pure hypercholesterolemia, unspecified: Secondary | ICD-10-CM | POA: Diagnosis not present

## 2023-07-15 DIAGNOSIS — R7303 Prediabetes: Secondary | ICD-10-CM

## 2023-07-15 LAB — POCT GLYCOSYLATED HEMOGLOBIN (HGB A1C): Hemoglobin A1C: 6 % — AB (ref 4.0–5.6)

## 2023-07-15 MED ORDER — LOSARTAN POTASSIUM-HCTZ 50-12.5 MG PO TABS: 1.0000 | ORAL_TABLET | Freq: Every day | ORAL | 1 refills | Status: DC

## 2023-07-15 MED ORDER — B-12 500 MCG SL SUBL: 1.0000 | SUBLINGUAL_TABLET | SUBLINGUAL | 1 refills | Status: AC

## 2023-07-15 MED ORDER — METFORMIN HCL ER 750 MG PO TB24: 750.0000 mg | ORAL_TABLET | Freq: Every day | ORAL | 1 refills | Status: DC

## 2023-07-15 MED ORDER — HYDROCHLOROTHIAZIDE 12.5 MG PO CAPS: 12.5000 mg | ORAL_CAPSULE | Freq: Every day | ORAL | 1 refills | Status: DC

## 2023-07-15 NOTE — Progress Notes (Signed)
 Name: [name] [name]   MRN: [id number]    DOB: 05/02/1971   Date:07/15/2023       Progress Note  Subjective  Chief Complaint  Chief Complaint  Patient presents with   Medical Management of Chronic Issues   HPI   Pre-diabetes: there is a family history of DM and she has a personal  history of gestational diabetes, denies polyphagia, polydipsia or polyuria. A1C was  6.1 % for a while.   She started Metformin  500 mg ER since Summer 22 , she is now on 750 mg dose since August 2024 due to A1C going up to 6.4 % . She has changed her life style and A1C today is down to 6 %    Overweight: she has changed her diet, lost 7 lbs in the past 3 months eating healthier and being mindful about her diet, also physically active 3 times a week   HTN: she is compliant with medication,only on HCTZ , bp is at goal, no chest pain, palpitation or sob. 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 BP was elevated twice during her visit and we will add ARB, discussed possible side effects of medication. She will check bp at work and come in sooner if not at goal with new regiment    Hypercholesteremia : discussed lipid panel , there is a family history of heart disease but in males in the late 93's. Discussed starting statin therapy, she is currently taking otc fish oil and we will recheck labs    The 10-year ASCVD risk score (Arnett DK, et al., 2019) is: 9.2%   Values used to calculate the score:     Age: 53 years     Sex: Female     Is Non-Hispanic African American: Yes     Diabetic: No     Tobacco smoker: No     Systolic Blood Pressure: 146 mmHg     Is BP treated: Yes     HDL Cholesterol: 56 mg/dL     Total Cholesterol: 264 mg/dL     Vitamin D  def:she is taking a rx vitamin D  every other week., last level was at goal   B12 deficiency: advised to start suple     Patient Active Problem List   Diagnosis Date Noted    Vitamin D  insufficiency 08/21/2022   Rectal polyp    Diverticulosis of large intestine without diverticulitis    Hiatal hernia    History of iron deficiency anemia    Pre-diabetes 10/13/2017   Intermittent low back pain 10/13/2017   History of gestational diabetes 10/13/2017   Hyperlipidemia 05/08/2016   Essential hypertension 12/20/2014   Gastro-esophageal reflux disease without esophagitis 01/25/2010    Past Surgical History:  Procedure Laterality Date   CESAREAN SECTION     COLONOSCOPY WITH PROPOFOL  N/A 07/08/2018   Procedure: COLONOSCOPY WITH PROPOFOL ;  Surgeon: Janalyn Keene NOVAK, MD;  Location: [facility];  Service: Endoscopy;  Laterality: N/A;   ESOPHAGOGASTRODUODENOSCOPY (EGD) WITH PROPOFOL  N/A 07/08/2018   Procedure: ESOPHAGOGASTRODUODENOSCOPY (EGD) WITH PROPOFOL ;  Surgeon: Janalyn Keene NOVAK, MD;  Location: [facility];  Service: Endoscopy;  Laterality: N/A;   TEAR DUCT PROBING      Family History  Problem Relation Age of Onset   Hypertension Mother    Hyperlipidemia Mother    Hypertension Father    Breast cancer Neg Hx     Social History  Tobacco Use   Smoking status: Never   Smokeless tobacco: Never  Substance Use Topics   Alcohol use: Yes    Alcohol/week: 1.0 standard drink of alcohol    Types: 1 Glasses of wine per week    Comment: seldom  every 4-5 months     Current Outpatient Medications:    hydrochlorothiazide  (MICROZIDE ) 12.5 MG capsule, Take 1 capsule (12.5 mg total) by mouth daily., Disp: 90 capsule, Rfl: 1   metFORMIN  (GLUCOPHAGE -XR) 750 MG 24 hr tablet, Take 1 tablet (750 mg total) by mouth daily with breakfast., Disp: 90 tablet, Rfl: 0   Omega-3 Fatty Acids (OMEGA-3 FISH OIL PO), Take 1,400 mg by mouth daily., Disp: , Rfl:    Vitamin D , Ergocalciferol , (DRISDOL ) 1.25 MG (50000 UNIT) CAPS capsule, TAKE 1 CAPSULE (50,000 UNITS TOTAL) BY MOUTH EVERY 14 (FOURTEEN) DAYS., Disp: 6 capsule, Rfl: 0  Allergies  Allergen Reactions    Zantac [Ranitidine Hcl]     I personally reviewed active problem list, medication list, allergies with the patient/caregiver today.   ROS  Ten systems reviewed and is negative except as mentioned in HPI    Objective  Vitals:   07/15/23 0803  BP: (!) 146/84  Pulse: 93  Resp: 16  Temp: 98.1 F (36.7 C)  TempSrc: Oral  SpO2: 96%  Weight: 140 lb 8 oz (63.7 kg)  Height: 5' (1.524 m)    Body mass index is 27.44 kg/m.  Physical Exam  Constitutional: Patient appears well-developed and well-nourished.Overweight. No distress.  HEENT: head atraumatic, normocephalic, pupils equal and reactive to light, , neck supple, throat within normal limits Cardiovascular: Normal rate, regular rhythm and normal heart sounds.  No murmur heard. No BLE edema. Pulmonary/Chest: Effort normal and breath sounds normal. No respiratory distress. Abdominal: Soft.  There is no tenderness. Psychiatric: Patient has a normal mood and affect. behavior is normal. Judgment and thought content normal.   Recent Results (from the past 2160 hours)  POCT glycosylated hemoglobin (Hb A1C)     Status: Abnormal   Collection Time: 07/15/23  8:16 AM  Result Value Ref Range   Hemoglobin A1C 6.0 (A) 4.0 - 5.6 %   HbA1c POC (<> result, manual entry)     HbA1c, POC (prediabetic range)     HbA1c, POC (controlled diabetic range)      Diabetic Foot Exam:     PHQ2/9:    07/15/2023    8:00 AM 04/12/2023    7:50 AM 01/15/2023    8:39 AM 08/21/2022    7:59 AM 02/20/2022    9:00 AM  Depression screen PHQ 2/9  Decreased Interest 0 0 0 0 0  Down, Depressed, Hopeless 0 0 0 0 0  PHQ - 2 Score 0 0 0 0 0  Altered sleeping 0 0 0 0 0  Tired, decreased energy 0 0 0 0 0  Change in appetite 0 0 0 0 0  Feeling bad or failure about yourself  0 0 0 0 0  Trouble concentrating 0 0 0 0 0  Moving slowly or fidgety/restless 0 0 0 0 0  Suicidal thoughts 0 0 0 0 0  PHQ-9 Score 0 0 0 0 0  Difficult doing work/chores Not difficult at all         phq 9 is negative  Fall Risk:    07/15/2023    8:00 AM 04/12/2023    7:50 AM 01/15/2023    8:39 AM 08/21/2022    7:59 AM 02/20/2022  9:00 AM  Fall Risk   Falls in the past year? 0 0 0 0 0  Number falls in past yr: 0 0 0 0 0  Injury with Fall? 0 0 0 0 0  Risk for fall due to : No Fall Risks No Fall Risks No Fall Risks No Fall Risks No Fall Risks  Follow up Falls prevention discussed;Education provided;Falls evaluation completed Falls prevention discussed Falls prevention discussed Falls prevention discussed Falls prevention discussed     Assessment & Plan  1. Pre-diabetes (Primary)  - POCT glycosylated hemoglobin (Hb A1C) - metFORMIN  (GLUCOPHAGE -XR) 750 MG 24 hr tablet; Take 1 tablet (750 mg total) by mouth daily with breakfast.  Dispense: 90 tablet; Refill: 1  2. Essential hypertension  BP is high , we will change to Losartan  hydrochlorothiazide  50/12.5 mg today   3. Low vitamin B12 level  Start otc SL a few times a week   4. Pure hypercholesterolemia   Continue life style modification

## 2023-07-16 ENCOUNTER — Ambulatory Visit: Payer: BC Managed Care – PPO | Admitting: Family Medicine

## 2023-12-17 ENCOUNTER — Other Ambulatory Visit: Payer: Self-pay | Admitting: Family Medicine

## 2023-12-17 DIAGNOSIS — E559 Vitamin D deficiency, unspecified: Secondary | ICD-10-CM

## 2024-01-10 ENCOUNTER — Other Ambulatory Visit: Payer: Self-pay | Admitting: Family Medicine

## 2024-01-10 DIAGNOSIS — I10 Essential (primary) hypertension: Secondary | ICD-10-CM

## 2024-01-10 DIAGNOSIS — R7303 Prediabetes: Secondary | ICD-10-CM

## 2024-01-10 NOTE — Telephone Encounter (Signed)
 Pt needed a follow up 6 months (around 01/12/2024) can we please try to get her scheduled sooner than Nov 2025. I will give enough refill until sooner appt

## 2024-01-10 NOTE — Telephone Encounter (Signed)
 Appt sch'ed for Sept 24th with Dr Glenard. That was S[facility] first availability

## 2024-01-10 NOTE — Telephone Encounter (Unsigned)
 Copied from CRM [id number]. Topic: Clinical - Medication Refill >> Jan 10, 2024 10:11 AM Everette C wrote: Medication: losartan -hydrochlorothiazide  (HYZAAR) 50-12.5 MG tablet [[id number]]  metFORMIN  (GLUCOPHAGE -XR) 750 MG 24 hr tablet [[id number]]  Has the patient contacted their pharmacy? Yes (Agent: If no, request that the patient contact the pharmacy for the refill. If patient does not wish to contact the pharmacy document the reason why and proceed with request.) (Agent: If yes, when and what did the pharmacy advise?)  This is the patient's preferred pharmacy:  [id number] - [place], [place] - [address] [place] [address] Phone: [phone] Fax: [phone]  Is this the correct pharmacy for this prescription? Yes If no, delete pharmacy and type the correct one.   Has the prescription been filled recently? Yes  Is the patient out of the medication? Yes  Has the patient been seen for an appointment in the last year OR does the patient have an upcoming appointment? No  Can we respond through MyChart? No  Agent: Please be advised that Rx refills may take up to 3 business days. We ask that you follow-up with your pharmacy.

## 2024-01-12 ENCOUNTER — Ambulatory Visit: Payer: 59 | Admitting: Family Medicine

## 2024-02-08 ENCOUNTER — Other Ambulatory Visit: Payer: Self-pay | Admitting: Family Medicine

## 2024-02-08 DIAGNOSIS — I10 Essential (primary) hypertension: Secondary | ICD-10-CM

## 2024-02-10 ENCOUNTER — Other Ambulatory Visit: Payer: Self-pay | Admitting: Family Medicine

## 2024-02-10 DIAGNOSIS — R7303 Prediabetes: Secondary | ICD-10-CM

## 2024-02-10 DIAGNOSIS — I10 Essential (primary) hypertension: Secondary | ICD-10-CM

## 2024-03-01 ENCOUNTER — Ambulatory Visit: Admitting: Family Medicine

## 2024-03-01 ENCOUNTER — Encounter: Payer: Self-pay | Admitting: Family Medicine

## 2024-03-01 VITALS — BP 130/80 | HR 89 | Resp 16 | Ht 60.0 in | Wt 143.8 lb

## 2024-03-01 DIAGNOSIS — R7303 Prediabetes: Secondary | ICD-10-CM

## 2024-03-01 DIAGNOSIS — E7800 Pure hypercholesterolemia, unspecified: Secondary | ICD-10-CM

## 2024-03-01 DIAGNOSIS — R7989 Other specified abnormal findings of blood chemistry: Secondary | ICD-10-CM

## 2024-03-01 DIAGNOSIS — E559 Vitamin D deficiency, unspecified: Secondary | ICD-10-CM | POA: Diagnosis not present

## 2024-03-01 DIAGNOSIS — I10 Essential (primary) hypertension: Secondary | ICD-10-CM | POA: Diagnosis not present

## 2024-03-01 DIAGNOSIS — Z23 Encounter for immunization: Secondary | ICD-10-CM

## 2024-03-01 MED ORDER — METFORMIN HCL ER 750 MG PO TB24
750.0000 mg | ORAL_TABLET | Freq: Every day | ORAL | 1 refills | Status: AC
Start: 2024-03-01 — End: ?

## 2024-03-01 MED ORDER — LOSARTAN POTASSIUM-HCTZ 50-12.5 MG PO TABS: 1.0000 | ORAL_TABLET | Freq: Every day | ORAL | 1 refills | Status: AC

## 2024-03-01 MED ORDER — VITAMIN D (ERGOCALCIFEROL) 1.25 MG (50000 UNIT) PO CAPS: 50000.0000 [IU] | ORAL_CAPSULE | ORAL | 1 refills | Status: AC

## 2024-03-01 NOTE — Progress Notes (Signed)
 Name: [name] [name]   MRN: [id number]    DOB: 08-07-1970   Date:03/01/2024       Progress Note  Subjective  Chief Complaint  Chief Complaint  Patient presents with   Medical Management of Chronic Issues   Discussed the use of AI scribe software for clinical note transcription with the patient, who gave verbal consent to proceed.  History of Present Illness [name] [name] is a 53 year old female with prediabetes and hypertension who presents for a routine follow-up visit.  She has a history of prediabetes with her A1c previously reaching 6.4%. She has made significant lifestyle changes, including dietary modifications and increased physical activity, which have resulted in a reduction of her A1c. She exercises three days a week, has cut out sugary beverages, and uses the [place] app to monitor her food choices. She started metformin  last year.  Her blood pressure has improved since February, she is currently taking losartan  hczt without any side effects.  She does not regularly check her blood pressure at home but has been mindful of her salt intake, using [employer] for cooking.  Her cholesterol levels were high in August of last year, with LDL reaching 184. She is currently taking omega-3 fish oil. She is open to further testing to assess her cardiac risk.   She takes B12 every other day and is due for a recheck today. She also takes prescription vitamin D  every other week on Fridays.  She is due for a colonoscopy and plans to contact [facility] for scheduling. Her mammogram is already scheduled through [facility].  No chest pain, palpitations, excessive hunger, or thirst. No recent changes in weight.    Patient Active Problem List   Diagnosis Date Noted   Pure hypercholesterolemia 07/15/2023   Low vitamin B12 level 07/15/2023   Vitamin D  insufficiency 08/21/2022   Rectal polyp    Diverticulosis of large intestine without diverticulitis    Hiatal hernia    History of iron deficiency anemia     Pre-diabetes 10/13/2017   Intermittent low back pain 10/13/2017   History of gestational diabetes 10/13/2017   Essential hypertension 12/20/2014   Gastro-esophageal reflux disease without esophagitis 01/25/2010    Past Surgical History:  Procedure Laterality Date   CESAREAN SECTION     COLONOSCOPY WITH PROPOFOL  N/A 07/08/2018   Procedure: COLONOSCOPY WITH PROPOFOL ;  Surgeon: Janalyn Keene NOVAK, MD;  Location: [facility];  Service: Endoscopy;  Laterality: N/A;   ESOPHAGOGASTRODUODENOSCOPY (EGD) WITH PROPOFOL  N/A 07/08/2018   Procedure: ESOPHAGOGASTRODUODENOSCOPY (EGD) WITH PROPOFOL ;  Surgeon: Janalyn Keene NOVAK, MD;  Location: [facility];  Service: Endoscopy;  Laterality: N/A;   TEAR DUCT PROBING      Family History  Problem Relation Age of Onset   Hypertension Mother    Hyperlipidemia Mother    Hypertension Father    Breast cancer Neg Hx     Social History   Tobacco Use   Smoking status: Never   Smokeless tobacco: Never  Substance Use Topics   Alcohol use: Yes    Alcohol/week: 1.0 standard drink of alcohol    Types: 1 Glasses of wine per week    Comment: seldom  every 4-5 months     Current Outpatient Medications:    Cyanocobalamin  (B-12) 500 MCG SUBL, Place 1 tablet under the tongue every other day., Disp: 30 tablet, Rfl: 1   Omega-3 Fatty Acids (OMEGA-3 FISH OIL PO), Take 1,400 mg by mouth daily., Disp: , Rfl:    losartan -hydrochlorothiazide  (HYZAAR)  50-12.5 MG tablet, Take 1 tablet by mouth daily., Disp: 90 tablet, Rfl: 1   metFORMIN  (GLUCOPHAGE -XR) 750 MG 24 hr tablet, Take 1 tablet (750 mg total) by mouth daily with breakfast., Disp: 90 tablet, Rfl: 1   Vitamin D , Ergocalciferol , (DRISDOL ) 1.25 MG (50000 UNIT) CAPS capsule, Take 1 capsule (50,000 Units total) by mouth every 14 (fourteen) days., Disp: 6 capsule, Rfl: 1  Allergies  Allergen Reactions   Zantac [Ranitidine Hcl]     I personally reviewed active problem list, medication list, allergies,  family history with the patient/caregiver today.   ROS  Ten systems reviewed and is negative except as mentioned in HPI    Objective Physical Exam CONSTITUTIONAL: Patient appears well-developed and well-nourished. No distress. HEENT: Head atraumatic, normocephalic, neck supple. Pharynx normal. CARDIOVASCULAR: Normal rate, regular rhythm and normal heart sounds. No murmur heard. No BLE edema. PULMONARY: Effort normal and breath sounds normal. Lungs clear to auscultation bilaterally. No respiratory distress. ABDOMINAL: There is no tenderness or distention. MUSCULOSKELETAL: Normal gait. Without gross motor or sensory deficit. PSYCHIATRIC: Patient has a normal mood and affect. Behavior is normal. Judgment and thought content normal.  Vitals:   03/01/24 0927  BP: 130/80  Pulse: 89  Resp: 16  SpO2: 99%  Weight: 143 lb 12.8 oz (65.2 kg)  Height: 5' (1.524 m)    Body mass index is 28.08 kg/m.   PHQ2/9:    03/01/2024    9:24 AM 07/15/2023    8:00 AM 04/12/2023    7:50 AM 01/15/2023    8:39 AM 08/21/2022    7:59 AM  Depression screen PHQ 2/9  Decreased Interest 0 0 0 0 0  Down, Depressed, Hopeless 0 0 0 0 0  PHQ - 2 Score 0 0 0 0 0  Altered sleeping  0 0 0 0  Tired, decreased energy  0 0 0 0  Change in appetite  0 0 0 0  Feeling bad or failure about yourself   0 0 0 0  Trouble concentrating  0 0 0 0  Moving slowly or fidgety/restless  0 0 0 0  Suicidal thoughts  0 0 0 0  PHQ-9 Score  0 0 0 0  Difficult doing work/chores  Not difficult at all       phq 9 is negative  Fall Risk:    03/01/2024    9:24 AM 07/15/2023    8:00 AM 04/12/2023    7:50 AM 01/15/2023    8:39 AM 08/21/2022    7:59 AM  Fall Risk   Falls in the past year? 0 0 0 0 0  Number falls in past yr: 0 0 0 0 0  Injury with Fall? 0 0 0 0 0  Risk for fall due to : No Fall Risks No Fall Risks No Fall Risks No Fall Risks No Fall Risks  Follow up Falls evaluation completed Falls prevention discussed;Education  provided;Falls evaluation completed Falls prevention discussed Falls prevention discussed Falls prevention discussed      Assessment & Plan Prediabetes Prediabetes improved with lifestyle changes and metformin . Emphasized maintaining these changes to delay diabetes progression. - Continue metformin  750 mg. - Encourage continued dietary modifications and physical activity. - Monitor for diabetes symptoms such as increased hunger, thirst, and urination.  Essential hypertension Blood pressure improved with hydrochlorothiazide , losartan , and dietary salt reduction. - Continue losartan  and hydrochlorothiazide . - Encourage continued dietary salt reduction. - Monitor blood pressure regularly.  Hypercholesterolemia Hypercholesterolemia with previous LDL of 184, possibly familial.  Discussed cardiac panel to assess cardiovascular risk. - Order CardioIQ Advanced Lipid Panel. - Continue omega-3 fish oil. - Consider cholesterol medication based on panel results.  Vitamin D  deficiency Vitamin D  deficiency managed with prescription vitamin D . - Continue prescription vitamin D  biweekly.  Vitamin B12 level monitoring B12 levels previously trending down but within normal range. - Order B12 level test. - Continue B12 supplementation every other day.

## 2024-03-03 ENCOUNTER — Ambulatory Visit: Payer: Self-pay | Admitting: Family Medicine

## 2024-03-05 LAB — CARDIO IQ(R) ADVANCED LIPID PANEL
Apolipoprotein B: 129 mg/dL — ABNORMAL HIGH (ref <90)
Cholesterol: 250 mg/dL — ABNORMAL HIGH (ref <200)
HDL LARGE: 8434 nmol/L (ref >6729)
HDL: 63 mg/dL (ref >49)
LDL Cholesterol (Calc): 164 mg/dL — ABNORMAL HIGH (ref <100)
LDL Medium: 511 nmol/L — ABNORMAL HIGH (ref <215)
LDL Particle Number: 2317 nmol/L — ABNORMAL HIGH (ref <1138)
LDL Peak Size: 218.2 Angstrom — ABNORMAL LOW (ref >222.9)
LDL Small: 421 nmol/L — ABNORMAL HIGH (ref <142)
Lipoprotein (a): 125 nmol/L — ABNORMAL HIGH (ref <75)
Non-HDL Cholesterol (Calc): 187 mg/dL — ABNORMAL HIGH (ref <130)
Total CHOL/HDL Ratio: 4 calc (ref <5.0)
Triglycerides: 112 mg/dL (ref <150)

## 2024-03-05 LAB — COMPREHENSIVE METABOLIC PANEL WITH GFR
AG Ratio: 1.6 (calc) (ref 1.0–2.5)
ALT: 13 U/L (ref 6–29)
AST: 19 U/L (ref 10–35)
Albumin: 4.9 g/dL (ref 3.6–5.1)
Alkaline phosphatase (APISO): 61 U/L (ref 37–153)
BUN: 14 mg/dL (ref 7–25)
CO2: 29 mmol/L (ref 20–32)
Calcium: 10.3 mg/dL (ref 8.6–10.4)
Chloride: 102 mmol/L (ref 98–110)
Creat: 0.81 mg/dL (ref 0.50–1.03)
Globulin: 3 g/dL (ref 1.9–3.7)
Glucose, Bld: 84 mg/dL (ref 65–99)
Potassium: 4.2 mmol/L (ref 3.5–5.3)
Sodium: 142 mmol/L (ref 135–146)
Total Bilirubin: 0.3 mg/dL (ref 0.2–1.2)
Total Protein: 7.9 g/dL (ref 6.1–8.1)
eGFR: 86 mL/min/1.73m2 (ref >=60)

## 2024-03-05 LAB — CBC WITH DIFFERENTIAL/PLATELET
Absolute Lymphocytes: 1379 {cells}/uL (ref 850–3900)
Absolute Monocytes: 448 {cells}/uL (ref 200–950)
Basophils Absolute: 49 {cells}/uL (ref 0–200)
Basophils Relative: 0.7 %
Eosinophils Absolute: 98 {cells}/uL (ref 15–500)
Eosinophils Relative: 1.4 %
HCT: 38.8 % (ref 35.0–45.0)
Hemoglobin: 13 g/dL (ref 11.7–15.5)
MCH: 29.7 pg (ref 27.0–33.0)
MCHC: 33.5 g/dL (ref 32.0–36.0)
MCV: 88.8 fL (ref 80.0–100.0)
MPV: 10.7 fL (ref 7.5–12.5)
Monocytes Relative: 6.4 %
Neutro Abs: 5026 {cells}/uL (ref 1500–7800)
Neutrophils Relative %: 71.8 %
Platelets: 350 Thousand/uL (ref 140–400)
RBC: 4.37 Million/uL (ref 3.80–5.10)
RDW: 13.8 % (ref 11.0–15.0)
Total Lymphocyte: 19.7 %
WBC: 7 Thousand/uL (ref 3.8–10.8)

## 2024-03-05 LAB — HEMOGLOBIN A1C
Hgb A1c MFr Bld: 6 % — ABNORMAL HIGH (ref <5.7)
Mean Plasma Glucose: 126 mg/dL
eAG (mmol/L): 7 mmol/L

## 2024-03-05 LAB — B12 AND FOLATE PANEL
Folate: 19.8 ng/mL
Vitamin B-12: 1025 pg/mL (ref 200–1100)

## 2024-03-05 LAB — VITAMIN D 25 HYDROXY (VIT D DEFICIENCY, FRACTURES): Vit D, 25-Hydroxy: 75 ng/mL (ref 30–100)

## 2024-03-22 ENCOUNTER — Encounter: Payer: Self-pay | Admitting: Emergency Medicine

## 2024-03-22 ENCOUNTER — Ambulatory Visit: Payer: Self-pay | Admitting: *Deleted

## 2024-03-22 ENCOUNTER — Ambulatory Visit
Admission: EM | Admit: 2024-03-22 | Discharge: 2024-03-22 | Disposition: A | Discharge status: Home / Self Care | Attending: Emergency Medicine | Admitting: Emergency Medicine

## 2024-03-22 DIAGNOSIS — M546 Pain in thoracic spine: Secondary | ICD-10-CM

## 2024-03-22 MED ORDER — PREDNISONE 10 MG (21) PO TBPK: ORAL_TABLET | Freq: Every day | ORAL | 0 refills | Status: DC

## 2024-03-22 MED ORDER — KETOROLAC TROMETHAMINE 30 MG/ML IJ SOLN
30.0000 mg | Freq: Once | INTRAMUSCULAR | Status: AC
  Administered 2024-03-22: 30 mg via INTRAMUSCULAR

## 2024-03-22 MED ORDER — CYCLOBENZAPRINE HCL 10 MG PO TABS: 10.0000 mg | ORAL_TABLET | Freq: Every day | ORAL | 0 refills | Status: DC

## 2024-03-22 NOTE — Telephone Encounter (Signed)
 FYI Only or Action Required?: FYI only for provider.  Patient was last seen in primary care on 03/01/2024 by Glenard Mire, MD.  Called Nurse Triage reporting Back Pain.  Symptoms began several days ago.  Interventions attempted: OTC medications: tylenol.  Symptoms are: unchanged.  Triage Disposition: See PCP When Office is Open (Within 3 Days)  Patient/caregiver understands and will follow disposition?: yes  Patient states she is going to go to [facility] today- she will call back if needed.  Reason for Disposition  [1] MODERATE back pain (e.g., interferes with normal activities) AND [2] present > 3 days  Answer Assessment - Initial Assessment Questions 1. ONSET: When did the pain begin? (e.g., minutes, hours, days)     3 days ago 2. LOCATION: Where does it hurt? (upper, mid or lower back)     R side-middle back 3. SEVERITY: How bad is the pain?  (e.g., Scale 1-10; mild, moderate, or severe)     On/off- spasm- 10/10 4. PATTERN: Is the pain constant? (e.g., yes, no; constant, intermittent)      Comes and goes 5. RADIATION: Does the pain shoot into your legs or somewhere else?     No radiation 6. CAUSE:  What do you think is causing the back pain?      Back spasm hx 7. BACK OVERUSE:  Any recent lifting of heavy objects, strenuous work or exercise?     Patient lifts- maybe a little heavy 8. MEDICINES: What have you taken so far for the pain? (e.g., nothing, acetaminophen, NSAIDS)     tylenol 9. NEUROLOGIC SYMPTOMS: Do you have any weakness, numbness, or problems with bowel/bladder control?     no 10. OTHER SYMPTOMS: Do you have any other symptoms? (e.g., fever, abdomen pain, burning with urination, blood in urine)       no  Protocols used: Back Pain-A-AH   Copied from CRM #[id number]. Topic: Clinical - Red Word Triage >> Mar 22, 2024  4:11 PM Wess RAMAN wrote: Red Word that prompted transfer to Nurse Triage: Back spasm for 3 days. Patient has been taken tylenol  500mg  and it is not working. She also lift weights.   Would like appt with PCP or another doctor.

## 2024-03-22 NOTE — ED Triage Notes (Signed)
 Patient complains of right sided back spasm  x 5 days.  Denies pain at this time. Patient took Tylenol earlier with relief.

## 2024-03-22 NOTE — ED Provider Notes (Signed)
 CAY RALPH PELT    CSN: [id number] Arrival date & time: 03/22/24  1909      History   Chief Complaint Chief Complaint  Patient presents with   Spasms    HPI [name] [name] is a 53 y.o. female.   Patient presents for evaluation of right sided mid back pain beginning 5 days ago, described as a intermittent spasming.  Symptoms started after deep tissue massage.  Has attempted Tylenol which has only been somewhat helpful.  Denies radiating pain, numbness or tingling, urinary changes.  Currently works out 3 times a week with weight lifting  Past Medical History:  Diagnosis Date   Essential hypertension, benign     Patient Active Problem List   Diagnosis Date Noted   Pure hypercholesterolemia 07/15/2023   Low vitamin B12 level 07/15/2023   Vitamin D  insufficiency 08/21/2022   Rectal polyp    Diverticulosis of large intestine without diverticulitis    Hiatal hernia    History of iron deficiency anemia    Pre-diabetes 10/13/2017   Intermittent low back pain 10/13/2017   History of gestational diabetes 10/13/2017   Essential hypertension 12/20/2014   Gastro-esophageal reflux disease without esophagitis 01/25/2010    Past Surgical History:  Procedure Laterality Date   CESAREAN SECTION     COLONOSCOPY WITH PROPOFOL  N/A 07/08/2018   Procedure: COLONOSCOPY WITH PROPOFOL ;  Surgeon: Janalyn Keene NOVAK, MD;  Location: [facility];  Service: Endoscopy;  Laterality: N/A;   ESOPHAGOGASTRODUODENOSCOPY (EGD) WITH PROPOFOL  N/A 07/08/2018   Procedure: ESOPHAGOGASTRODUODENOSCOPY (EGD) WITH PROPOFOL ;  Surgeon: Janalyn Keene NOVAK, MD;  Location: [facility] ENDOSCOPY;  Service: Endoscopy;  Laterality: N/A;   TEAR DUCT PROBING      OB History   No obstetric history on file.      Home Medications    Prior to Admission medications   Medication Sig Start Date End Date Taking? Authorizing Provider  Cyanocobalamin  (B-12) 500 MCG SUBL Place 1 tablet under the tongue every other  day. 07/15/23   Sowles, Krichna, MD  cyclobenzaprine (FLEXERIL) 10 MG tablet Take 1 tablet (10 mg total) by mouth at bedtime. 03/22/24   Lucious Zou, Shelba SAUNDERS, NP  losartan -hydrochlorothiazide  (HYZAAR) 50-12.5 MG tablet Take 1 tablet by mouth daily. 03/01/24   Sowles, Krichna, MD  metFORMIN  (GLUCOPHAGE -XR) 750 MG 24 hr tablet Take 1 tablet (750 mg total) by mouth daily with breakfast. 03/01/24   Glenard Mire, MD  Omega-3 Fatty Acids (OMEGA-3 FISH OIL PO) Take 1,400 mg by mouth daily.    [provider]  predniSONE (STERAPRED UNI-PAK 21 TAB) 10 MG (21) TBPK tablet Take by mouth daily. Take 6 tabs by mouth daily  for 1 days, then 5 tabs for 1 days, then 4 tabs for 1 days, then 3 tabs for 1 days, 2 tabs for 1 days, then 1 tab by mouth daily for 1 days 03/22/24   Teresa Shelba R, NP  Vitamin D , Ergocalciferol , (DRISDOL ) 1.25 MG (50000 UNIT) CAPS capsule Take 1 capsule (50,000 Units total) by mouth every 14 (fourteen) days. 03/01/24   Sowles, Krichna, MD    Family History Family History  Problem Relation Age of Onset   Hypertension Mother    Hyperlipidemia Mother    Hypertension Father    Breast cancer Neg Hx     Social History Social History   Tobacco Use   Smoking status: Never   Smokeless tobacco: Never  Vaping Use   Vaping status: Never Used  Substance Use Topics   Alcohol use: Yes  Alcohol/week: 1.0 standard drink of alcohol    Types: 1 Glasses of wine per week    Comment: seldom  every 4-5 months   Drug use: No     Allergies   Zantac [ranitidine hcl]   Review of Systems Review of Systems   Physical Exam Triage Vital Signs ED Triage Vitals  Encounter Vitals Group     BP 03/22/24 1921 (!) 145/86     Girls Systolic BP Percentile --      Girls Diastolic BP Percentile --      Boys Systolic BP Percentile --      Boys Diastolic BP Percentile --      Pulse Rate 03/22/24 1921 80     Resp 03/22/24 1921 18     Temp 03/22/24 1921 98.4 F (36.9 C)     Temp Source  03/22/24 1921 Oral     SpO2 03/22/24 1921 99 %     Weight --      Height --      Head Circumference --      Peak Flow --      Pain Score 03/22/24 1923 0     Pain Loc --      Pain Education --      Exclude from Growth Chart --    No data found.  Updated Vital Signs BP (!) 145/86 (BP Location: Left Arm)   Pulse 80   Temp 98.4 F (36.9 C) (Oral)   Resp 18   LMP 10/09/2020 (Approximate)   SpO2 99%   Visual Acuity Right Eye Distance:   Left Eye Distance:   Bilateral Distance:    Right Eye Near:   Left Eye Near:    Bilateral Near:     Physical Exam Constitutional:      Appearance: Normal appearance.  Eyes:     Extraocular Movements: Extraocular movements intact.  Pulmonary:     Effort: Pulmonary effort is normal.  Musculoskeletal:     Comments: Unable to reproduce tenderness on the M cot no ecchymosis swelling or deformity, no spinal tenderness noted, able to complete full range of motion  Neurological:     Mental Status: She is alert and oriented to person, place, and time. Mental status is at baseline.      UC Treatments / Results  Labs (all labs ordered are listed, but only abnormal results are displayed) Labs Reviewed - No data to display  EKG   Radiology No results found.  Procedures Procedures (including critical care time)  Medications Ordered in UC Medications  ketorolac (TORADOL) 30 MG/ML injection 30 mg (30 mg Intramuscular Given 03/22/24 1938)    Initial Impression / Assessment and Plan / UC Course  I have reviewed the triage vital signs and the nursing notes.  Pertinent labs & imaging results that were available during my care of the patient were reviewed by me and considered in my medical decision making (see chart for details).  Acute right-sided thoracic pain  Etiology most likely muscular, no spinal tenderness or signs of saddle anesthesia, imaging deferred, Toradol IM given and prescribed prednisone and Flexeril for home use  recommended supportive care through [facility] with activity as tolerated, walker referral given to orthopedics Final Clinical Impressions(s) / UC Diagnoses   Final diagnoses:  Acute right-sided thoracic back pain     Discharge Instructions      Your pain is most likely caused by irritation to the muscles .  You have been given an injection of Toradol to  help reduce inflammation and pain and ideally will start to see some relief within an hour  Start tomorrow take prednisone every morning with food as directed to continue the process of bolus, avoid ibuprofen while taking but may use Tylenol or any topical medicines additionally  You may use muscle relaxant as needed for comfort if having difficulty sleeping at nighttime  You may use heating pad in 15 minute intervals as needed for additional comfort  Begin stretching affected area daily for 10 minutes as tolerated to further loosen muscles   When lying down place pillow underneath and between knees for support  Can try sleeping without pillow on firm mattress   Practice good posture: head back, shoulders back, chest forward, pelvis back and weight distributed evenly on both legs  If pain persist after recommended treatment or reoccurs if may be beneficial to follow up with orthopedic specialist for evaluation, this doctor specializes in the bones and can manage your symptoms long-term with options such as but not limited to imaging, medications or physical therapy      ED Prescriptions     Medication Sig Dispense Auth. Provider   predniSONE (STERAPRED UNI-PAK 21 TAB) 10 MG (21) TBPK tablet  (Status: Discontinued) Take by mouth daily. Take 6 tabs by mouth daily  for 1 days, then 5 tabs for 1 days, then 4 tabs for 1 days, then 3 tabs for 1 days, 2 tabs for 1 days, then 1 tab by mouth daily for 1 days 21 tablet Sonjia Wilcoxson R, NP   cyclobenzaprine (FLEXERIL) 10 MG tablet  (Status: Discontinued) Take 1 tablet (10 mg total) by mouth at  bedtime. 10 tablet Alainna Stawicki R, NP   cyclobenzaprine (FLEXERIL) 10 MG tablet Take 1 tablet (10 mg total) by mouth at bedtime. 10 tablet Robbi Scurlock R, NP   predniSONE (STERAPRED UNI-PAK 21 TAB) 10 MG (21) TBPK tablet Take by mouth daily. Take 6 tabs by mouth daily  for 1 days, then 5 tabs for 1 days, then 4 tabs for 1 days, then 3 tabs for 1 days, 2 tabs for 1 days, then 1 tab by mouth daily for 1 days 21 tablet Jewelene Mairena, Shelba SAUNDERS, NP      PDMP not reviewed this encounter.   Teresa Shelba SAUNDERS, NP 03/22/24 1944

## 2024-03-22 NOTE — Discharge Instructions (Signed)
 Your pain is most likely caused by irritation to the muscles .  You have been given an injection of Toradol to help reduce inflammation and pain and ideally will start to see some relief within an hour  Start tomorrow take prednisone every morning with food as directed to continue the process of bolus, avoid ibuprofen while taking but may use Tylenol or any topical medicines additionally  You may use muscle relaxant as needed for comfort if having difficulty sleeping at nighttime  You may use heating pad in 15 minute intervals as needed for additional comfort  Begin stretching affected area daily for 10 minutes as tolerated to further loosen muscles   When lying down place pillow underneath and between knees for support  Can try sleeping without pillow on firm mattress   Practice good posture: head back, shoulders back, chest forward, pelvis back and weight distributed evenly on both legs  If pain persist after recommended treatment or reoccurs if may be beneficial to follow up with orthopedic specialist for evaluation, this doctor specializes in the bones and can manage your symptoms long-term with options such as but not limited to imaging, medications or physical therapy

## 2024-04-13 NOTE — Patient Instructions (Signed)
 Preventive Care 58-55 Years Old, Female  Preventive care refers to lifestyle choices and visits with your health care provider that can promote health and wellness. Preventive care visits are also called wellness exams.  What can I expect for my preventive care visit?  Counseling  Your health care provider may ask you questions about your:  Medical history, including:  Past medical problems.  Family medical history.  Pregnancy history.  Current health, including:  Menstrual cycle.  Method of birth control.  Emotional well-being.  Home life and relationship well-being.  Sexual activity and sexual health.  Lifestyle, including:  Alcohol, nicotine or tobacco, and drug use.  Access to firearms.  Diet, exercise, and sleep habits.  Work and work Astronomer.  Sunscreen use.  Safety issues such as seatbelt and bike helmet use.  Physical exam  Your health care provider will check your:  Height and weight. These may be used to calculate your BMI (body mass index). BMI is a measurement that tells if you are at a healthy weight.  Waist circumference. This measures the distance around your waistline. This measurement also tells if you are at a healthy weight and may help predict your risk of certain diseases, such as type 2 diabetes and high blood pressure.  Heart rate and blood pressure.  Body temperature.  Skin for abnormal spots.  What immunizations do I need?    Vaccines are usually given at various ages, according to a schedule. Your health care provider will recommend vaccines for you based on your age, medical history, and lifestyle or other factors, such as travel or where you work.  What tests do I need?  Screening  Your health care provider may recommend screening tests for certain conditions. This may include:  Lipid and cholesterol levels.  Diabetes screening. This is done by checking your blood sugar (glucose) after you have not eaten for a while (fasting).  Pelvic exam and Pap test.  Hepatitis B test.  Hepatitis C  test.  HIV (human immunodeficiency virus) test.  STI (sexually transmitted infection) testing, if you are at risk.  Lung cancer screening.  Colorectal cancer screening.  Mammogram. Talk with your health care provider about when you should start having regular mammograms. This may depend on whether you have a family history of breast cancer.  BRCA-related cancer screening. This may be done if you have a family history of breast, ovarian, tubal, or peritoneal cancers.  Bone density scan. This is done to screen for osteoporosis.  Talk with your health care provider about your test results, treatment options, and if necessary, the need for more tests.  Follow these instructions at home:  Eating and drinking    Eat a diet that includes fresh fruits and vegetables, whole grains, lean protein, and low-fat dairy products.  Take vitamin and mineral supplements as recommended by your health care provider.  Do not drink alcohol if:  Your health care provider tells you not to drink.  You are pregnant, may be pregnant, or are planning to become pregnant.  If you drink alcohol:  Limit how much you have to 0-1 drink a day.  Know how much alcohol is in your drink. In the [place] one drink equals one 12 oz bottle of beer (355 mL), one 5 oz glass of wine (148 mL), or one 1 oz glass of hard liquor (44 mL).  Lifestyle  Brush your teeth every morning and night with fluoride toothpaste. Floss one time each day.  Exercise for at least  30 minutes 5 or more days each week.  Do not use any products that contain nicotine or tobacco. These products include cigarettes, chewing tobacco, and vaping devices, such as e-cigarettes. If you need help quitting, ask your health care provider.  Do not use drugs.  If you are sexually active, practice safe sex. Use a condom or other form of protection to prevent STIs.  If you do not wish to become pregnant, use a form of birth control. If you plan to become pregnant, see your health care provider for a  prepregnancy visit.  Take aspirin only as told by your health care provider. Make sure that you understand how much to take and what form to take. Work with your health care provider to find out whether it is safe and beneficial for you to take aspirin daily.  Find healthy ways to manage stress, such as:  Meditation, yoga, or listening to music.  Journaling.  Talking to a trusted person.  Spending time with friends and family.  Minimize exposure to UV radiation to reduce your risk of skin cancer.  Safety  Always wear your seat belt while driving or riding in a vehicle.  Do not drive:  If you have been drinking alcohol. Do not ride with someone who has been drinking.  When you are tired or distracted.  While texting.  If you have been using any mind-altering substances or drugs.  Wear a helmet and other protective equipment during sports activities.  If you have firearms in your house, make sure you follow all gun safety procedures.  Seek help if you have been physically or sexually abused.  What's next?  Visit your health care provider once a year for an annual wellness visit.  Ask your health care provider how often you should have your eyes and teeth checked.  Stay up to date on all vaccines.  This information is not intended to replace advice given to you by your health care provider. Make sure you discuss any questions you have with your health care provider.  Document Revised: 11/20/2020 Document Reviewed: 11/20/2020  Elsevier Patient Education  2024 [employer].

## 2024-04-14 ENCOUNTER — Ambulatory Visit (INDEPENDENT_AMBULATORY_CARE_PROVIDER_SITE_OTHER): Payer: 59 | Admitting: Family Medicine

## 2024-04-14 ENCOUNTER — Encounter: Payer: Self-pay | Admitting: Family Medicine

## 2024-04-14 VITALS — BP 122/76 | HR 96 | Resp 16 | Ht <= 58 in | Wt 147.8 lb

## 2024-04-14 DIAGNOSIS — Z1211 Encounter for screening for malignant neoplasm of colon: Secondary | ICD-10-CM

## 2024-04-14 DIAGNOSIS — Z13820 Encounter for screening for osteoporosis: Secondary | ICD-10-CM

## 2024-04-14 DIAGNOSIS — E2839 Other primary ovarian failure: Secondary | ICD-10-CM

## 2024-04-14 DIAGNOSIS — Z0000 Encounter for general adult medical examination without abnormal findings: Secondary | ICD-10-CM | POA: Diagnosis not present

## 2024-04-14 DIAGNOSIS — E7800 Pure hypercholesterolemia, unspecified: Secondary | ICD-10-CM | POA: Diagnosis not present

## 2024-04-14 MED ORDER — ROSUVASTATIN CALCIUM 10 MG PO TABS: 10.0000 mg | ORAL_TABLET | Freq: Every day | ORAL | 1 refills | Status: AC

## 2024-04-14 NOTE — Progress Notes (Signed)
 Name: [name] [name]   MRN: [id number]    DOB: 09-22-1970   Date:04/14/2024       Progress Note  Subjective  Chief Complaint  Chief Complaint  Patient presents with   Annual Exam    HPI  Patient presents for annual CPE.  Diet: she meal preps her on Sunday, eats fish, balanced diet , lots of green vegetabls, cutting down on bread Exercise:  discussed importance of increasing physical activity Last Eye Exam: encouraged to complete Last Dental Exam: completed  Flowsheet Row Office Visit from 04/14/2024 in [facility]  AUDIT-C Score 0   Depression: Phq 9 is  negative    04/14/2024    8:00 AM 03/01/2024    9:24 AM 07/15/2023    8:00 AM 04/12/2023    7:50 AM 01/15/2023    8:39 AM  Depression screen PHQ 2/9  Decreased Interest 0 0 0 0 0  Down, Depressed, Hopeless 0 0 0 0 0  PHQ - 2 Score 0 0 0 0 0  Altered sleeping   0 0 0  Tired, decreased energy   0 0 0  Change in appetite   0 0 0  Feeling bad or failure about yourself    0 0 0  Trouble concentrating   0 0 0  Moving slowly or fidgety/restless   0 0 0  Suicidal thoughts   0 0 0  PHQ-9 Score   0  0  0   Difficult doing work/chores   Not difficult at all       Data saved with a previous flowsheet row definition   Hypertension: BP Readings from Last 3 Encounters:  04/14/24 122/76  03/22/24 (!) 145/86  03/01/24 130/80   Obesity: Wt Readings from Last 3 Encounters:  04/14/24 147 lb 12.8 oz (67 kg)  03/01/24 143 lb 12.8 oz (65.2 kg)  07/15/23 140 lb 8 oz (63.7 kg)   BMI Readings from Last 3 Encounters:  04/14/24 30.89 kg/m  03/01/24 28.08 kg/m  07/15/23 27.44 kg/m     Vaccines: reviewed with the patient.   Hep C Screening: completed STD testing and prevention (HIV/chl/gon/syphilis): not interested  Intimate partner violence: negative screen  Sexual History : no pain or discomfort  Menstrual History/LMP/Abnormal Bleeding: post menopausal Discussed importance of follow up if any  post-menopausal bleeding: yes  Incontinence Symptoms: negative for symptoms   Breast cancer:  - Last Mammogram: scheduled  - BRCA gene screening: N/A  Osteoporosis Prevention : Discussed high calcium and vitamin D  supplementation, weight bearing exercises Bone density :yes   Cervical cancer screening: up-to-date  Skin cancer: Discussed monitoring for atypical lesions  Colorectal cancer: referral placed    Lung cancer:  Low Dose CT Chest recommended if Age 28-80 years, 20 pack-year currently smoking OR have quit w/in 15years. Patient does not qualify for screen   ECG: 2019 recheck next visit   Advanced Care Planning: A voluntary discussion about advance care planning including the explanation and discussion of advance directives.  Discussed health care proxy and Living will, and the patient was able to identify a health care proxy as husband .  Patient does not have a living will and power of attorney of health care   Patient Active Problem List   Diagnosis Date Noted   Pure hypercholesterolemia 07/15/2023   Low vitamin B12 level 07/15/2023   Vitamin D  insufficiency 08/21/2022   Rectal polyp    Diverticulosis of large intestine without diverticulitis  Hiatal hernia    History of iron deficiency anemia    Pre-diabetes 10/13/2017   Intermittent low back pain 10/13/2017   History of gestational diabetes 10/13/2017   Essential hypertension 12/20/2014   Gastro-esophageal reflux disease without esophagitis 01/25/2010    Past Surgical History:  Procedure Laterality Date   CESAREAN SECTION     COLONOSCOPY WITH PROPOFOL  N/A 07/08/2018   Procedure: COLONOSCOPY WITH PROPOFOL ;  Surgeon: Janalyn Keene NOVAK, MD;  Location: [facility] ENDOSCOPY;  Service: Endoscopy;  Laterality: N/A;   ESOPHAGOGASTRODUODENOSCOPY (EGD) WITH PROPOFOL  N/A 07/08/2018   Procedure: ESOPHAGOGASTRODUODENOSCOPY (EGD) WITH PROPOFOL ;  Surgeon: Janalyn Keene NOVAK, MD;  Location: [facility] ENDOSCOPY;  Service: Endoscopy;   Laterality: N/A;   TEAR DUCT PROBING      Family History  Problem Relation Age of Onset   Hypertension Mother    Hyperlipidemia Mother    Hypertension Father    Breast cancer Neg Hx     Social History   Socioeconomic History   Marital status: Married    Spouse name: Ricky    Number of children: 2   Years of education: Not on file   Highest education level: Master's degree (e.g., MA, MS, MEng, MEd, MSW, MBA)  Occupational History   Occupation: engineer, 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   Tobacco Use   Smoking status: Never   Smokeless tobacco: Never  Vaping Use   Vaping status: Never Used  Substance and Sexual Activity   Alcohol use: Not Currently    Alcohol/week: 1.0 standard drink of alcohol    Types: 1 Glasses of wine per week    Comment: seldom  every 4-5 months   Drug use: No   Sexual activity: Yes    Partners: Male    Birth control/protection: Surgical  Other Topics Concern   Not on file  Social History Narrative   Two boys, 14 years apart    Social Drivers of Health   Financial Resource Strain: Low Risk  (04/14/2024)   Overall Financial Resource Strain (CARDIA)    Difficulty of Paying Living Expenses: Not hard at all  Food Insecurity: No Food Insecurity (04/14/2024)   Hunger Vital Sign    Worried About Running Out of Food in the Last Year: Never true    Ran Out of Food in the Last Year: Never true  Transportation Needs: No Transportation Needs (04/14/2024)   PRAPARE - Administrator, Civil Service (Medical): No    Lack of Transportation (Non-Medical): No  Physical Activity: Insufficiently Active (04/14/2024)   Exercise Vital Sign    Days of Exercise per Week: 3 days    Minutes of Exercise per Session: 30 min  Stress: No Stress Concern Present (04/14/2024)   [employer] of Occupational Health - Occupational Stress Questionnaire    Feeling of Stress: Not at all  Social Connections: Moderately Integrated (04/14/2024)    Social Connection and Isolation Panel    Frequency of Communication with Friends and Family: More than three times a week    Frequency of Social Gatherings with Friends and Family: Three times a week    Attends Religious Services: More than 4 times per year    Active Member of Clubs or Organizations: No    Attends Banker Meetings: Never    Marital Status: Married  Catering Manager Violence: Not At Risk (04/14/2024)   Humiliation, Afraid, Rape, and Kick questionnaire    Fear of Current or Ex-Partner: No    Emotionally  Abused: No    Physically Abused: No    Sexually Abused: No     Current Outpatient Medications:    Cyanocobalamin  (B-12) 500 MCG SUBL, Place 1 tablet under the tongue every other day., Disp: 30 tablet, Rfl: 1   losartan -hydrochlorothiazide  (HYZAAR) 50-12.5 MG tablet, Take 1 tablet by mouth daily., Disp: 90 tablet, Rfl: 1   metFORMIN  (GLUCOPHAGE -XR) 750 MG 24 hr tablet, Take 1 tablet (750 mg total) by mouth daily with breakfast., Disp: 90 tablet, Rfl: 1   Omega-3 Fatty Acids (OMEGA-3 FISH OIL PO), Take 1,400 mg by mouth daily., Disp: , Rfl:    rosuvastatin (CRESTOR) 10 MG tablet, Take 1 tablet (10 mg total) by mouth daily., Disp: 90 tablet, Rfl: 1   Vitamin D , Ergocalciferol , (DRISDOL ) 1.25 MG (50000 UNIT) CAPS capsule, Take 1 capsule (50,000 Units total) by mouth every 14 (fourteen) days., Disp: 6 capsule, Rfl: 1   cyclobenzaprine (FLEXERIL) 10 MG tablet, Take 1 tablet (10 mg total) by mouth at bedtime. (Patient not taking: Reported on 04/14/2024), Disp: 10 tablet, Rfl: 0   predniSONE (STERAPRED UNI-PAK 21 TAB) 10 MG (21) TBPK tablet, Take by mouth daily. Take 6 tabs by mouth daily  for 1 days, then 5 tabs for 1 days, then 4 tabs for 1 days, then 3 tabs for 1 days, 2 tabs for 1 days, then 1 tab by mouth daily for 1 days (Patient not taking: Reported on 04/14/2024), Disp: 21 tablet, Rfl: 0  Allergies  Allergen Reactions   Zantac [Ranitidine Hcl]       ROS  Constitutional: Negative for fever or weight change.  Respiratory: Negative for cough and shortness of breath.   Cardiovascular: Negative for chest pain or palpitations.  Gastrointestinal: Negative for abdominal pain, no bowel changes.  Musculoskeletal: Negative for gait problem or joint swelling.  Skin: Negative for rash.  Neurological: Negative for dizziness or headache.  No other specific complaints in a complete review of systems (except as listed in HPI above).   Objective  Vitals:   04/14/24 0803  BP: 122/76  Pulse: 96  Resp: 16  SpO2: 99%  Weight: 147 lb 12.8 oz (67 kg)  Height: 4' 10 (1.473 m)    Body mass index is 30.89 kg/m.  Physical Exam  Constitutional: Patient appears well-developed and well-nourished. No distress.  HENT: Head: Normocephalic and atraumatic. Ears: B TMs ok, no erythema or effusion; Nose: Nose normal. Mouth/Throat: Oropharynx is clear and moist. No oropharyngeal exudate.  Eyes: Conjunctivae and EOM are normal. Pupils are equal, round, and reactive to light. No scleral icterus.  Neck: Normal range of motion. Neck supple. No JVD present. No thyromegaly present.  Cardiovascular: Normal rate, regular rhythm and normal heart sounds.  No murmur heard. No BLE edema. Pulmonary/Chest: Effort normal and breath sounds normal. No respiratory distress. Abdominal: Soft. Bowel sounds are normal, no distension. There is no tenderness. no masses Breast: no lumps or masses, no nipple discharge or rashes FEMALE GENITALIA:  Not done  RECTAL: not done  Musculoskeletal: Normal range of motion, no joint effusions. No gross deformities Neurological: he is alert and oriented to person, place, and time. No cranial nerve deficit. Coordination, balance, strength, speech and gait are normal.  Skin: Skin is warm and dry. No rash noted. No erythema.  Psychiatric: Patient has a normal mood and affect. behavior is normal. Judgment and thought content normal.      Assessment & Plan  1. Well adult exam (Primary)   2. Pure hypercholesterolemia  -  rosuvastatin (CRESTOR) 10 MG tablet; Take 1 tablet (10 mg total) by mouth daily.  Dispense: 90 tablet; Refill: 1 Reviewed last labs and willing to start statin therapy   3. Colon cancer screening  - Ambulatory referral to Gastroenterology  4. Osteoporosis screening  - DG Bone Density; Future  5. Ovarian failure  - DG Bone Density; Future    -USPSTF grade A and B recommendations reviewed with patient; age-appropriate recommendations, preventive care, screening tests, etc discussed and encouraged; healthy living encouraged; see AVS for patient education given to patient -Discussed importance of 150 minutes of physical activity weekly, eat two servings of fish weekly, eat one serving of tree nuts ( cashews, pistachios, pecans, almonds.SABRA) every other day, eat 6 servings of fruit/vegetables daily and drink plenty of water and avoid sweet beverages.   -Reviewed Health Maintenance: Yes.

## 2024-04-18 ENCOUNTER — Other Ambulatory Visit: Payer: Self-pay

## 2024-04-18 ENCOUNTER — Telehealth: Payer: Self-pay

## 2024-04-18 DIAGNOSIS — Z860100 Personal history of colon polyps, unspecified: Secondary | ICD-10-CM

## 2024-04-18 DIAGNOSIS — Z1211 Encounter for screening for malignant neoplasm of colon: Secondary | ICD-10-CM

## 2024-04-18 MED ORDER — NA SULFATE-K SULFATE-MG SULF 17.5-3.13-1.6 GM/177ML PO SOLN: 1.0000 | Freq: Once | ORAL | 0 refills | Status: AC

## 2024-04-18 NOTE — Telephone Encounter (Signed)
 Gastroenterology Pre-Procedure Review  Request Date: 08/01/24 Requesting Physician: Dr. Melany  PATIENT REVIEW QUESTIONS: The patient responded to the following health history questions as indicated:    1. Are you having any GI issues? no 2. Do you have a personal history of Polyps? yes (last colonoscopy 07/08/2018 by Dr. Janalyn recommended repeat in 5 years) 3. Do you have a family history of Colon Cancer or Polyps? yes (grandmother colon cancer) 4. Diabetes Mellitus? yes (prediabetic takes metformin  has been advised verbally and noted on instructions to stop 2 days prior) 5. Joint replacements in the past 12 months?no 6. Major health problems in the past 3 months?no 7. Any artificial heart valves, MVP, or defibrillator?no    MEDICATIONS & ALLERGIES:    Patient reports the following regarding taking any anticoagulation/antiplatelet therapy:   Plavix, Coumadin, Eliquis, Xarelto, Lovenox, Pradaxa, Brilinta, or Effient? no Aspirin? no  Patient confirms/reports the following medications:  Current Outpatient Medications  Medication Sig Dispense Refill   Cyanocobalamin  (B-12) 500 MCG SUBL Place 1 tablet under the tongue every other day. 30 tablet 1   losartan -hydrochlorothiazide  (HYZAAR) 50-12.5 MG tablet Take 1 tablet by mouth daily. 90 tablet 1   metFORMIN  (GLUCOPHAGE -XR) 750 MG 24 hr tablet Take 1 tablet (750 mg total) by mouth daily with breakfast. 90 tablet 1   Omega-3 Fatty Acids (OMEGA-3 FISH OIL PO) Take 1,400 mg by mouth daily.     rosuvastatin (CRESTOR) 10 MG tablet Take 1 tablet (10 mg total) by mouth daily. 90 tablet 1   Vitamin D , Ergocalciferol , (DRISDOL ) 1.25 MG (50000 UNIT) CAPS capsule Take 1 capsule (50,000 Units total) by mouth every 14 (fourteen) days. 6 capsule 1   No current facility-administered medications for this visit.    Patient confirms/reports the following allergies:  Allergies  Allergen Reactions   Zantac [Ranitidine Hcl]     No orders of the  defined types were placed in this encounter.   AUTHORIZATION INFORMATION Primary Insurance: 1D#: Group #:  Secondary Insurance: 1D#: Group #:  SCHEDULE INFORMATION: Date: 08/01/24 Time: Location: [facility]

## 2024-04-28 LAB — HM MAMMOGRAPHY

## 2024-05-08 ENCOUNTER — Telehealth: Payer: Self-pay | Admitting: Emergency Medicine

## 2024-05-08 NOTE — Telephone Encounter (Signed)
 Copied from CRM [id number]. Topic: Clinical - Request for Lab/Test Order >> May 08, 2024 11:01 AM [name] [name] wrote: Reason for CRM: Patient requests an order for a 3D screening bilateral mammogram to be sent to [facility] in [place].

## 2024-05-11 ENCOUNTER — Telehealth: Payer: Self-pay | Admitting: Family Medicine

## 2024-05-11 NOTE — Telephone Encounter (Signed)
 Received and faxed back.

## 2024-05-11 NOTE — Telephone Encounter (Signed)
 Copied from CRM [id number]. Topic: Clinical - Medical Advice >> May 11, 2024  1:40 PM Tiffini S wrote: Reason for CRM: Ebony with [facility] breast Imaging dept [phone]  states that the patient had a mammogram done and  additional imaging will be needed, faxed request on 05/01/24 to request order- need to schedule appointment

## 2024-05-22 ENCOUNTER — Encounter: Payer: Self-pay | Admitting: Family Medicine

## 2024-08-01 ENCOUNTER — Ambulatory Visit: Admit: 2024-08-01 | Admitting: Gastroenterology

## 2024-08-01 SURGERY — COLONOSCOPY
Anesthesia: General

## 2024-08-29 ENCOUNTER — Ambulatory Visit: Admitting: Family Medicine
